# Patient Record
Sex: Female | Born: 1935
Health system: Southern US, Community
[De-identification: ages and names within clinical notes are randomized; demographics above are authoritative.]

## PROBLEM LIST (undated history)

## (undated) DIAGNOSIS — M5030 Other cervical disc degeneration, unspecified cervical region: Secondary | ICD-10-CM

## (undated) DIAGNOSIS — R519 Headache, unspecified: Secondary | ICD-10-CM

## (undated) DIAGNOSIS — R51 Headache: Secondary | ICD-10-CM

## (undated) DIAGNOSIS — E162 Hypoglycemia, unspecified: Secondary | ICD-10-CM

## (undated) DIAGNOSIS — R531 Weakness: Secondary | ICD-10-CM

## (undated) DIAGNOSIS — F0390 Unspecified dementia without behavioral disturbance: Secondary | ICD-10-CM

## (undated) DIAGNOSIS — I10 Essential (primary) hypertension: Secondary | ICD-10-CM

## (undated) DIAGNOSIS — G8929 Other chronic pain: Secondary | ICD-10-CM

## (undated) DIAGNOSIS — J329 Chronic sinusitis, unspecified: Secondary | ICD-10-CM

## (undated) DIAGNOSIS — I2699 Other pulmonary embolism without acute cor pulmonale: Secondary | ICD-10-CM

## (undated) HISTORY — PX: APPENDECTOMY: SHX54

---

## 2000-07-13 ENCOUNTER — Ambulatory Visit (HOSPITAL_COMMUNITY): Admission: RE | Admit: 2000-07-13 | Discharge: 2000-07-13 | Payer: Self-pay | Admitting: Family Medicine

## 2000-12-23 ENCOUNTER — Encounter: Payer: Self-pay | Admitting: Family Medicine

## 2000-12-23 ENCOUNTER — Ambulatory Visit (HOSPITAL_COMMUNITY): Admission: RE | Admit: 2000-12-23 | Discharge: 2000-12-23 | Payer: Self-pay | Admitting: Family Medicine

## 2001-01-14 ENCOUNTER — Ambulatory Visit (HOSPITAL_COMMUNITY): Admission: RE | Admit: 2001-01-14 | Discharge: 2001-01-14 | Payer: Self-pay | Admitting: Family Medicine

## 2001-01-14 ENCOUNTER — Encounter: Payer: Self-pay | Admitting: Family Medicine

## 2001-03-04 ENCOUNTER — Encounter: Payer: Self-pay | Admitting: *Deleted

## 2001-03-04 ENCOUNTER — Emergency Department (HOSPITAL_COMMUNITY): Admission: EM | Admit: 2001-03-04 | Discharge: 2001-03-04 | Payer: Self-pay | Admitting: *Deleted

## 2001-09-01 ENCOUNTER — Encounter: Payer: Self-pay | Admitting: Internal Medicine

## 2001-09-01 ENCOUNTER — Inpatient Hospital Stay (HOSPITAL_COMMUNITY): Admission: EM | Admit: 2001-09-01 | Discharge: 2001-09-02 | Payer: Self-pay | Admitting: Internal Medicine

## 2001-09-04 ENCOUNTER — Emergency Department (HOSPITAL_COMMUNITY): Admission: EM | Admit: 2001-09-04 | Discharge: 2001-09-04 | Payer: Self-pay | Admitting: Emergency Medicine

## 2004-09-09 ENCOUNTER — Emergency Department (HOSPITAL_COMMUNITY): Admission: EM | Admit: 2004-09-09 | Discharge: 2004-09-09 | Payer: Self-pay | Admitting: Emergency Medicine

## 2004-10-10 ENCOUNTER — Emergency Department (HOSPITAL_COMMUNITY): Admission: EM | Admit: 2004-10-10 | Discharge: 2004-10-10 | Payer: Self-pay | Admitting: Emergency Medicine

## 2005-01-24 ENCOUNTER — Emergency Department (HOSPITAL_COMMUNITY): Admission: EM | Admit: 2005-01-24 | Discharge: 2005-01-24 | Payer: Self-pay | Admitting: Emergency Medicine

## 2005-02-21 ENCOUNTER — Emergency Department (HOSPITAL_COMMUNITY): Admission: EM | Admit: 2005-02-21 | Discharge: 2005-02-21 | Payer: Self-pay | Admitting: Emergency Medicine

## 2005-08-20 ENCOUNTER — Emergency Department (HOSPITAL_COMMUNITY): Admission: EM | Admit: 2005-08-20 | Discharge: 2005-08-20 | Payer: Self-pay | Admitting: Emergency Medicine

## 2005-10-31 ENCOUNTER — Emergency Department (HOSPITAL_COMMUNITY): Admission: EM | Admit: 2005-10-31 | Discharge: 2005-10-31 | Payer: Self-pay | Admitting: Emergency Medicine

## 2006-03-14 ENCOUNTER — Emergency Department (HOSPITAL_COMMUNITY): Admission: EM | Admit: 2006-03-14 | Discharge: 2006-03-14 | Payer: Self-pay | Admitting: Emergency Medicine

## 2006-03-23 ENCOUNTER — Emergency Department (HOSPITAL_COMMUNITY): Admission: EM | Admit: 2006-03-23 | Discharge: 2006-03-23 | Payer: Self-pay | Admitting: Emergency Medicine

## 2006-03-26 ENCOUNTER — Emergency Department (HOSPITAL_COMMUNITY): Admission: EM | Admit: 2006-03-26 | Discharge: 2006-03-26 | Payer: Self-pay | Admitting: Emergency Medicine

## 2006-03-28 ENCOUNTER — Emergency Department (HOSPITAL_COMMUNITY): Admission: EM | Admit: 2006-03-28 | Discharge: 2006-03-28 | Payer: Self-pay | Admitting: Emergency Medicine

## 2007-10-10 ENCOUNTER — Emergency Department (HOSPITAL_COMMUNITY): Admission: EM | Admit: 2007-10-10 | Discharge: 2007-10-10 | Payer: Self-pay | Admitting: Emergency Medicine

## 2007-10-27 ENCOUNTER — Emergency Department (HOSPITAL_COMMUNITY): Admission: EM | Admit: 2007-10-27 | Discharge: 2007-10-27 | Payer: Self-pay | Admitting: Emergency Medicine

## 2008-07-10 ENCOUNTER — Emergency Department (HOSPITAL_COMMUNITY): Admission: EM | Admit: 2008-07-10 | Discharge: 2008-07-10 | Payer: Self-pay | Admitting: Emergency Medicine

## 2008-10-16 ENCOUNTER — Emergency Department (HOSPITAL_COMMUNITY): Admission: EM | Admit: 2008-10-16 | Discharge: 2008-10-17 | Payer: Self-pay | Admitting: Emergency Medicine

## 2009-04-28 ENCOUNTER — Emergency Department (HOSPITAL_COMMUNITY): Admission: EM | Admit: 2009-04-28 | Discharge: 2009-04-28 | Payer: Self-pay | Admitting: Emergency Medicine

## 2010-06-23 LAB — URINALYSIS, ROUTINE W REFLEX MICROSCOPIC
Glucose, UA: NEGATIVE mg/dL
Nitrite: NEGATIVE
Protein, ur: NEGATIVE mg/dL
pH: 7 (ref 5.0–8.0)

## 2010-06-23 LAB — CBC
Hemoglobin: 15.7 g/dL — ABNORMAL HIGH (ref 12.0–15.0)
MCHC: 33.6 g/dL (ref 30.0–36.0)
Platelets: 141 10*3/uL — ABNORMAL LOW (ref 150–400)
RBC: 4.78 MIL/uL (ref 3.87–5.11)
RDW: 13.5 % (ref 11.5–15.5)

## 2010-06-23 LAB — DIFFERENTIAL
Lymphocytes Relative: 28 % (ref 12–46)
Lymphs Abs: 1.4 10*3/uL (ref 0.7–4.0)
Monocytes Absolute: 0.4 10*3/uL (ref 0.1–1.0)
Monocytes Relative: 9 % (ref 3–12)
Neutro Abs: 3.1 10*3/uL (ref 1.7–7.7)

## 2010-06-23 LAB — BASIC METABOLIC PANEL
CO2: 28 mEq/L (ref 19–32)
Calcium: 8.9 mg/dL (ref 8.4–10.5)
GFR calc Af Amer: >60 mL/min (ref >60)
GFR calc non Af Amer: >60 mL/min (ref >60)
Sodium: 140 mEq/L (ref 135–145)

## 2010-06-23 LAB — GLUCOSE, CAPILLARY: Glucose-Capillary: 98 mg/dL (ref 70–99)

## 2010-07-14 LAB — POCT CARDIAC MARKERS
CKMB, poc: 1.5 ng/mL (ref 1.0–8.0)
CKMB, poc: 3.1 ng/mL (ref 1.0–8.0)
Troponin i, poc: <0.05 ng/mL (ref 0.00–0.09)

## 2010-07-14 LAB — URINALYSIS, ROUTINE W REFLEX MICROSCOPIC
Hgb urine dipstick: NEGATIVE
Ketones, ur: NEGATIVE mg/dL
Nitrite: NEGATIVE
Protein, ur: NEGATIVE mg/dL

## 2010-07-14 LAB — URINE MICROSCOPIC-ADD ON

## 2010-07-14 LAB — GLUCOSE, CAPILLARY: Glucose-Capillary: 100 mg/dL — ABNORMAL HIGH (ref 70–99)

## 2010-07-14 LAB — POCT I-STAT, CHEM 8
Chloride: 106 mEq/L (ref 96–112)
Creatinine, Ser: 0.7 mg/dL (ref 0.4–1.2)
Glucose, Bld: 137 mg/dL — ABNORMAL HIGH (ref 70–99)
HCT: 48 % — ABNORMAL HIGH (ref 36.0–46.0)
Potassium: 3.9 mEq/L (ref 3.5–5.1)
Sodium: 141 mEq/L (ref 135–145)

## 2010-07-17 LAB — COMPREHENSIVE METABOLIC PANEL
AST: 19 U/L (ref 0–37)
CO2: 26 mEq/L (ref 19–32)
Calcium: 8.8 mg/dL (ref 8.4–10.5)
Creatinine, Ser: 0.6 mg/dL (ref 0.4–1.2)
GFR calc Af Amer: >60 mL/min (ref >60)
GFR calc non Af Amer: >60 mL/min (ref >60)
Total Protein: 6.4 g/dL (ref 6.0–8.3)

## 2010-07-17 LAB — URINALYSIS, ROUTINE W REFLEX MICROSCOPIC
Bilirubin Urine: NEGATIVE
Hgb urine dipstick: NEGATIVE
Ketones, ur: NEGATIVE mg/dL
Nitrite: NEGATIVE
Specific Gravity, Urine: <1.005 — ABNORMAL LOW (ref 1.005–1.030)

## 2010-07-17 LAB — DIFFERENTIAL
Basophils Absolute: 0 10*3/uL (ref 0.0–0.1)
Eosinophils Absolute: 0.1 10*3/uL (ref 0.0–0.7)
Eosinophils Relative: 2 % (ref 0–5)
Lymphocytes Relative: 41 % (ref 12–46)
Lymphs Abs: 2.4 10*3/uL (ref 0.7–4.0)
Neutrophils Relative %: 50 % (ref 43–77)

## 2010-07-17 LAB — GLUCOSE, CAPILLARY
Glucose-Capillary: 104 mg/dL — ABNORMAL HIGH (ref 70–99)
Glucose-Capillary: 106 mg/dL — ABNORMAL HIGH (ref 70–99)

## 2010-07-17 LAB — CBC
Hemoglobin: 15.9 g/dL — ABNORMAL HIGH (ref 12.0–15.0)
RBC: 4.78 MIL/uL (ref 3.87–5.11)
WBC: 5.9 10*3/uL (ref 4.0–10.5)

## 2010-07-17 LAB — URINE CULTURE

## 2010-07-17 LAB — APTT: aPTT: 27 seconds (ref 24–37)

## 2010-07-17 LAB — PROTIME-INR: INR: 1 (ref 0.00–1.49)

## 2010-08-23 NOTE — Consult Note (Signed)
[facility]  Patient:    [name], [name] Visit Number: [id number] MRN: [id number]          Service Type: MED Location: 3A A318 01 Attending Physician:  Cassell Smiles. Dictated by:   Tana Coast, P.A.C. Proc. Date: 09/01/01 Admit Date:  09/01/2001   CC:         Butch Penny, M.D.   Consultation Report  DATE OF BIRTH:  Dec 10, 1919.  REFERRING PHYSICIAN:  Butch Penny, M.D.  REASON FOR CONSULTATION:  Nausea, vomiting, diarrhea, hemoccult-positive stool.  HISTORY OF PRESENT ILLNESS:  The patient is an 77 year old Caucasian female patient of Dr. Renard Matter who was admitted overnight by Dr. Josefine Class (Hospitalists) for acute onset nausea, vomiting, diarrhea, with evidence of dehydration.  The patient reports eating dinner last night and approximately one hour later developed abdominal cramping followed by multiple diarrheal stools.  She took at least 3 Imodium tablets with no relief.  She, therefore, presented to the emergency department for further evaluation.  In the ED, she developed nausea and vomiting on several occasions.  There was no evidence of hematemesis, melena, or bright red blood per rectum.  She had a hemoccult-positive stool while in the emergency department.  The patient denies any history of fever or chills, chronic diarrhea, nausea or vomiting prior to this episode, heartburn, dysphagia, odynophagia, weight loss, anorexia.  She has had no history of travel or ill contacts.  She had not eaten out at restaurants.  At home prior to this episode, she was eating a boiled potato, chopped steak well done, and lima beans.  In the emergency department, white count 13.5, hemoglobin 17.5, hematocrit 51.3, neutrophils 79%, lymphocytes 16%, platelet 196,000.  Sodium 139, potassium 5.1, BUN 15, creatinine 1.1, glucose 151, total bilirubin 1.4, alkaline phosphatase 79, SGOT 47, SGPT 33, albumin 4.5, amylase 78, lipase 28. Urinalysis  revealed a cloudy yellow urine with 30 protein, small leukocyte esterase, 11 to 20 wbcs, many bacteria, specific gravity 1.020.  At 7 a.m. this morning, her potassium was 4.2, total bilirubin 1.0, alkaline phosphatase 64, SGOT 26, SGPT 30, albumin 3.4.  Direct bilirubin 0.2, indirect bilirubin 0.8.  The patient already feels better this afternoon.  She has had one bowel movement this morning and no more nausea or vomiting.  She was able to take a clear liquid lunch.  The patient takes aspirin on an as-needed basis for headaches and pain.  She has a family history of colon cancer with a brother who died at age 4. She had an upper GI series in October 2002 which revealed a small sliding hiatal hernia, mild to moderate duodenitis involving the bulb, possible gallstone.  Acute abdominal series this morning revealed probable gallstone and cardiomegaly but otherwise unremarkable.  HOME MEDICATIONS:  Aspirin p.r.n.  CURRENT MEDICATIONS: 1. Protonix 40 mg IV x 1. 2. Phenergan 12.5 mg IV q.6h. p.r.n. nausea. 3. Normal saline at 100 cc per hour.  ALLERGIES:  No known drug allergies.  PAST MEDICAL HISTORY: 1. Remote history of ovarian cyst in her 45s requiring surgical removal    with incidental appendectomy at that time. 2. History of right axillary lymph node biopsy which was benign, remotely. 3. The patient gives a history of taking Nexium last year for a possible    ulcer.  Upper GI series showed evidence of mild to moderate duodenitis    involving the bulbar duodenum at that time.  FAMILY HISTORY:  Her brother died at age 29 of colon cancer.  Her father died suddenly at 47 of unknown cause.  She had a daughter who died of breast cancer at age 26.  SOCIAL HISTORY:  She is widowed, had seven children, six living.  She lives with her son.  She denies any tobacco or alcohol use.  She is retired.  REVIEW OF SYSTEMS:  Please see HPI for GI.  General: Denies weight loss, weakness, or  fatigue.  Cardiopulmonary: Denies any chest pain or shortness of breath.  Genitourinary: Complains of urinary frequency but no dysuria.  PHYSICAL EXAMINATION:  VITAL SIGNS:  Weight 180.7.  T-max 99, current temperature 99.5.  Pulse 100, respirations 20, blood pressure 154/64.  GENERAL:  Very pleasant, well-nourished, well-developed, elderly Caucasian female in no acute distress.  SKIN:  Warn and dry, no jaundice.  HEENT:  Pupils are equal, round and reactive to light.  Conjunctivae pink. Sclerae nonicteric.  Oropharyngeal mucosa moist and pink.  No lesions, erythema, or exudate.  NECK:  No adenopathy, thyromegaly, or carotid bruits.  CHEST:  Lungs clear to auscultation.  CARDIAC:  Regular rate and rhythm, normal S1 and S2.  No murmurs, rubs, or gallops.  ABDOMEN:  Positive bowel sounds.  Protuberant but symmetrical.  Nontender. No organomegaly or masses.  RECTAL:  Adequate sphincter tone.  No masses in the rectal vault.  Yellow stool is strongly hemoccult positive.  EXTREMITIES:  No cyanosis or edema.  DIAGNOSTIC DATA:  As mentioned in HPI.  IMPRESSION:  The patient is an 77 year old Caucasian female with acute onset of nausea, vomiting, diarrhea, and found to have hemoccult-positive stools on rectal examination.  She also has a mild leukocytosis and a transient, mildly elevated total bilirubin and SGOT.  1. Suspect acute gastroenteritis, possibly food-borne illness versus viral.    Symptomatically, the patient is better today.  Symptoms seem to be    resolved. 2. Hemoccult-positive stool, probably incidental finding.  Hemoglobin and    hematocrit are up on admission, most likely secondary to dehydration.  Will    recheck her CBC in the morning to verify that there has not been a    significant drop.  Given her hemoccult-positive stools, would recommend    a colonoscopy (has family history of colon cancer, and the patient has    never undergone colonoscopy previously)  plus/minus     esophagogastroduodenoscopy.  The patient really has no symptoms worrisome    for peptic ulcer disease, but she is on aspirin p.r.n., and an upper    gastrointestinal series in October 2002 revealed mild to moderate    duodenitis. 3. Slight increase in SGOT and total bilirubin on admission, now resolved.    I suspect the slight bump in these numbers is secondary to acute    gastroenteritis.  At this time, I do not suspect biliary etiology. 4. Urinalysis revealed evidence of leukocyte esterase, 11 to 20 white    blood cells, and many bacteria.  The patient complains of urinary frequency    but no dysuria.  SUGGESTIONS: 1. Recheck CBC in the morning. 2. Consider urine culture. 3. Probable colonoscopy and EGD, questionably as an outpatient (to discuss    with Dr. Karilyn Cota).  The patient needs to have at least a colonoscopy    given her family history of colon cancer.  If there are no findings to    explain hemoccult-positive stool, would need to have an EGD as well.  We would like to thank Dr. Renard Matter for allowing [place] to take part in the care of  this patient. Dictated by:   Tana Coast, P.A.C. Attending Physician:  Cassell Smiles DD:  09/01/01 TD:  09/01/01 Job: [phone] [id number]

## 2010-08-23 NOTE — H&P (Signed)
[facility]  Patient:    [name], [name] Visit Number: [id number] MRN: [id number]          Service Type: MED Location: 3A A318 01 Attending Physician:  Cassell Smiles. Dictated by:   Renne Musca, M.D. Admit Date:  09/01/2001                           History and Physical  DATE OF BIRTH:  12/25/1919  HISTORY OF PRESENT ILLNESS:  The patient is an 77 year old Caucasian female, followed by Dr. Renard Matter, with an unremarkable past medical history, actually on no chronic medications, usually feels quite well, and was in her normal state of health until the evening prior to admission when after a small dinner of baked potato and chopped steak which she prepared at home she noted some abdominal cramping, which progressively worsened, and developed copious diarrhea, initially brown and formed and then noted to be black and watery. Because of ongoing abdominal pain and nausea she presented to the emergency room, where she proceeded to vomit quite a large amount.  She received Phenergan and Demerol in the emergency room as well as IV fluids and overall felt much improved.  On initial examination per Dr. Sherwood Gambler she was noted to have heme positive stools by guaiac, though no gross blood was noted.  The patient has no preexisting GI symptoms; i.e., reflux, change in her bowel habits (which are normally every day to every other day), no blood, mucus, or other complaints.  She has had no change in her appetite.  She has actually gained about 20 pounds over the last several years.  Overall she has been quite active.  She had no preceding fever or myalgias, arthralgias, headache, or anything else to suggest an acute viral syndrome.  No recent ingestion of suspicious foods; i.e., mayonnaise based, undercooked eggs, meat, etc.  She rarely eats out.  PAST MEDICAL HISTORY:  No hospitalization aside from:  1. Appendectomy.  2. Ovarian cyst many years  ago.  ALLERGIES:  No history of drug allergies.  CURRENT MEDICATIONS:  1. She took an Imodium last night but usually uses little over-the-counter     medications.  No supplements are noted.  2. Occasional vitamin C.  3. Occasional Tylenol.  SOCIAL HISTORY:  She denies any alcohol use or abuse.  No smoking.  She lives with her son and grandson, who are in good health and have no other symptoms; they tend to eat the same foods.  She is a Futures trader.  She has been a widow for ten years.  FAMILY HISTORY:  Pertinent for ______ who died with stomach cancer.  Brother with valvular heart disease, died at age 51.  She has a daughter age 40 die from breast cancer.  She has five sons and one remaining daughter, all alive and well.  Her father died of sudden death at age 29.  Mother died of stroke at age 34.  REVIEW OF SYSTEMS:  The patient is independent.  Functional.  No cardiovascular, GI, endocrine, or neurologic findings on Review Of Systems. She has not had a mammogram in many years.  She has never had a screening colonoscopy.  History of palpitations, although none recently.  PHYSICAL EXAMINATION:  GENERAL:  Alert, oriented, well-developed, well-nourished Caucasian female, who appears younger than her stated age.  VITAL SIGNS:  Blood pressure 130/70, pulse 110 and regular, respirations unlabored.  NEUROLOGIC:  Completely intact.  HEENT:  Oropharynx is slightly dry.  Conjunctivae clear.  PERRL.  Fundi not visualized.  NECK:  Supple.  No JVD, adenopathy, thyromegaly.  LUNGS:  Clear to auscultation anteriorly and posteriorly.  HEART:  Regular, tachycardic.  No murmur or gallop noted.  ABDOMEN:   Obese, soft, nontender to latent deep palpation.  Bowel sounds are positive.  EXTREMITIES:  Without clubbing, cyanosis, or edema.  Pulses distally are intact.  SKIN:  Without rash, lesion, or breakdown.  She has multiple cherry angiomas but no worrisome findings.  LABORATORY DATA:   UA with small amount of leukocyte esterase.  Glucose 151, potassium 5.1, sodium 139, BUN 15, creatinine 1.1, albumin 4.5, SGOT 47, alkaline phosphatase 79, bilirubin 1.4.  Amylase 78, lipase 28.  Hemoglobin 17.5, hematocrit 51, WBC 13.5; platelets, however, 196,000; 79% segs, 16% lymphs.  Acute abdominal series revealed no evidence of obstruction or free air.  ASSESSMENT:  1. Acute onset of abdominal pain associated with diarrhea and vomiting, most     likely viral versus ingestion.  Appears self-limited.  Otherwise, appears     quite comfortable.  2. Mild dehydration.  3. Heme positive stools.  4. Hyperglycemia.  5. Hemoconcentration versus other.  PLAN:  1. Admit to the hospital.  2. Hydrate.  3. She is hemodynamically stable.  Will check liver profile and monitor.  4. GI consultation has been obtained for the heme positive stools.  5. Follow up hyperglycemia.  She has only received saline in the emergency     room.  Hemoglobin A1C would be reasonable to assess for underlying     diabetes.  6. Hemoconcentration with associated leukocytosis with normal Differential.     Certainly may represent volume depletion but also need to consider other     issues such as polycythemia, although her platelet count is normal.  The     patient did receive some potassium as her initial potassium was reported     as being 3 but this has been corrected.  A stat serum potassium is pending     at this time and she did not receive the 40 mEq of potassium ordered in     the emergency room.  7. The patient at some point will need a screening mammogram and colonoscopy,     although probably not necessary during this hospitalization. Dictated by:   Renne Musca, M.D. Attending Physician:  Cassell Smiles DD:  09/01/01 TD:  09/02/01 Job: [id number]

## 2010-08-23 NOTE — Discharge Summary (Signed)
[facility]  Patient:    [name], [name] Visit Number: [id number] MRN: [id number]          Service Type: EMS Location: ED Attending Physician:  Herbert Seta Dictated by:   Butch Penny, M.D. Admit Date:  09/04/2001 Discharge Date: 09/04/2001                             Discharge Summary  DISCHARGE DIAGNOSES: 1. Urinary tract infection. 2. Gastroenteritis. 3. Dehydration. 4. Hyperglycemia.  CONDITION ON DISCHARGE:  Stable and improved.  HISTORY OF PRESENT ILLNESS:  This 77 year old female developed abdominal, crampy pain which progressively worsened.  She developed copious diarrhea that was black and watery.  Because of ongoing abdominal pain and nausea, she presented to the emergency room.  She was given Phenergan and Demerol. Intravenous fluids were started.  The patient was admitted for further care.  PHYSICAL EXAMINATION:  GENERAL:  Alert and oriented female.  VITAL SIGNS:  Blood pressure 130/70, pulse 110.  HEENT:  PERRLA.  TMs negative.  Oropharynx benign.  NECK:  Supple.  No JVD or thyroid abnormalities.  LUNGS:  Clear to P&A.  HEART:  Regular rate and rhythm with no murmurs.  ABDOMEN:  Soft, bowel sounds positive.  LABORATORY DATA AND X-RAY FINDINGS:  Admission CBC with WBC 13,500, hemoglobin 17.5, hematocrit 51.3.  Chemistries with sodium 139, potassium 5.1, chloride 105, CO2 26, glucose 151, BUN 15, creatinine 1.1, calcium 9.3.  Total protein 7.3, albumin 4.5.  Liver enzymes with SGOT 47, SGPT 33, total bilirubin 1.4. Urinalysis 11-20 wbcs.  HOSPITAL COURSE:  The patient, at the time of admission, was started on intravenous fluids with normal saline at 100 cc per hour with 20 mEq of Kay-Ciel to each bottle of fluid.  She was started on Protonix intravenously 40 mg daily.  Phenergan 12.5 mg IV q.6-8h. p.r.n. nausea.  The patient improved and urine was cultured.  The patient was scheduled for colonoscopy and then it  was canceled.  The patient was discharged after one day of hospitalization to be followed as an outpatient.  DISCHARGE MEDICATION:  Protonix 40 mg daily.  DIET:  Soft.  SPECIAL INSTRUCTIONS:  Gastroenterology service will do colonoscopy as an outpatient.  CONDITION ON DISCHARGE:  Stable. Dictated by:   Butch Penny, M.D. Attending Physician:  Herbert Seta DD:  10/04/01 TD:  10/05/01 Job: [id number]

## 2011-01-03 LAB — URINALYSIS, ROUTINE W REFLEX MICROSCOPIC
Nitrite: NEGATIVE
Protein, ur: NEGATIVE
Specific Gravity, Urine: <1.005 — ABNORMAL LOW
Urobilinogen, UA: 0.2

## 2011-01-03 LAB — DIFFERENTIAL
Lymphocytes Relative: 38
Monocytes Absolute: 0.4
Monocytes Relative: 7
Neutro Abs: 2.9

## 2011-01-03 LAB — COMPREHENSIVE METABOLIC PANEL
Albumin: 3.9
Alkaline Phosphatase: 62
BUN: 13
CO2: 29
Chloride: 108
Creatinine, Ser: 0.69
Glucose, Bld: 105 — ABNORMAL HIGH
Sodium: 141
Total Protein: 6.3

## 2011-01-03 LAB — CBC
Platelets: 155
RDW: 13.7
WBC: 5.5

## 2011-01-04 ENCOUNTER — Emergency Department (HOSPITAL_COMMUNITY)
Admission: EM | Admit: 2011-01-04 | Discharge: 2011-01-05 | Disposition: A | Discharge status: Home / Self Care | Payer: Medicare PPO | Attending: Emergency Medicine | Admitting: Emergency Medicine

## 2011-01-04 ENCOUNTER — Encounter: Payer: Self-pay | Admitting: *Deleted

## 2011-01-04 DIAGNOSIS — R531 Weakness: Secondary | ICD-10-CM

## 2011-01-04 DIAGNOSIS — Z79899 Other long term (current) drug therapy: Secondary | ICD-10-CM | POA: Insufficient documentation

## 2011-01-04 DIAGNOSIS — I10 Essential (primary) hypertension: Secondary | ICD-10-CM | POA: Insufficient documentation

## 2011-01-04 DIAGNOSIS — R5381 Other malaise: Secondary | ICD-10-CM | POA: Insufficient documentation

## 2011-01-04 HISTORY — DX: Essential (primary) hypertension: I10

## 2011-01-04 HISTORY — DX: Hypoglycemia, unspecified: E16.2

## 2011-01-04 LAB — CBC
Hemoglobin: 15.9 g/dL — ABNORMAL HIGH (ref 12.0–15.0)
MCH: 32.5 pg (ref 26.0–34.0)
MCV: 95.9 fL (ref 78.0–100.0)
RBC: 4.89 MIL/uL (ref 3.87–5.11)

## 2011-01-04 LAB — BASIC METABOLIC PANEL
BUN: 13 mg/dL (ref 6–23)
Calcium: 9.1 mg/dL (ref 8.4–10.5)
GFR calc non Af Amer: >60 mL/min (ref >60)
Glucose, Bld: 105 mg/dL — ABNORMAL HIGH (ref 70–99)

## 2011-01-04 LAB — URINALYSIS, ROUTINE W REFLEX MICROSCOPIC
Bilirubin Urine: NEGATIVE
Hgb urine dipstick: NEGATIVE
Protein, ur: NEGATIVE mg/dL
Specific Gravity, Urine: <1.005 — ABNORMAL LOW (ref 1.005–1.030)
Urobilinogen, UA: 0.2 mg/dL (ref 0.0–1.0)

## 2011-01-04 LAB — DIFFERENTIAL
Eosinophils Absolute: 0.1 10*3/uL (ref 0.0–0.7)
Eosinophils Relative: 2 % (ref 0–5)
Lymphs Abs: 2.3 10*3/uL (ref 0.7–4.0)
Monocytes Relative: 8 % (ref 3–12)

## 2011-01-04 LAB — URINE MICROSCOPIC-ADD ON

## 2011-01-04 NOTE — ED Notes (Signed)
Pt c/o feeling shaky and weak earlier today.

## 2011-01-04 NOTE — ED Provider Notes (Signed)
History     CSN: [id number] Arrival date & time: 01/04/2011  9:55 PM  Chief Complaint  Patient presents with  . Weakness  . Shaking    (Consider location/radiation/quality/duration/timing/severity/associated sxs/prior treatment) HPI Comments: Seen 2310. Patient states she has had a h/o of hypoglycemia in the past. She usually feels shaky prior to feeling weak. Tonight she felt shaky and weak. She took 2 glucose pills and had her son bring her to the ER.r. She eats three meals a day on a regular basis and has at least two snacks during the day. When she feels shaky, she usually has a protein snack. Tonight she felt her blood pressure might be elevated as well. She lives with her son and grandson. Dr. Renard Matter is her PCP.  Patient is a 77 y.o. female presenting with weakness. The history is provided by the patient (Patient states that she feels shaky when her sugars are low. She felt like that earlier tonight. Took 2 glucose tablets and had her son bring her to the ER for evaluaion. ).  Weakness Primary symptoms comment: generalized weakness and feeling shaky The symptoms are improving. Context: sitting in her home.  Additional symptoms include weakness. Additional symptoms do not include pain, tinnitus or vertigo.    Past Medical History  Diagnosis Date  . Hypertension   . Hypoglycemia     History reviewed. No pertinent past surgical history.  No family history on file.  History  Substance Use Topics  . Smoking status: Never Smoker   . Smokeless tobacco: Not on file  . Alcohol Use: No    OB History    Grav Para Term Preterm Abortions TAB SAB Ect Mult Living                  Review of Systems  HENT: Negative for tinnitus.   Neurological: Positive for weakness. Negative for vertigo.    Allergies  Codeine  Home Medications   Current Outpatient Rx  Name Route Sig Dispense Refill  . AMLODIPINE BESYLATE 5 MG PO TABS Oral Take 5 mg by mouth daily.      Marland Kitchen LOSARTAN  POTASSIUM 50 MG PO TABS Oral Take 50 mg by mouth daily.        BP 179/67  Pulse 90  Temp 98.6 F (37 C)  Resp 20  Ht 5\' 2"  (1.575 m)  Wt 187 lb (84.823 kg)  BMI 34.20 kg/m2  SpO2 100%  Physical Exam  Constitutional: She is oriented to person, place, and time. She appears well-developed and well-nourished. No distress.  HENT:  Head: Normocephalic and atraumatic.  Eyes: EOM are normal.  Neck: Normal range of motion. Neck supple.  Cardiovascular: Normal rate, normal heart sounds and intact distal pulses.   Pulmonary/Chest: Effort normal and breath sounds normal.  Abdominal: Soft.  Musculoskeletal: Normal range of motion.  Neurological: She is alert and oriented to person, place, and time.  Skin: Skin is warm and dry.    ED Course  Procedures (including critical care time)  Labs Reviewed  GLUCOSE, CAPILLARY - Abnormal; Notable for the following:    Glucose-Capillary 137 (*)    All other components within normal limits  CBC - Abnormal; Notable for the following:    Hemoglobin 15.9 (*)    HCT 46.9 (*)    All other components within normal limits  BASIC METABOLIC PANEL - Abnormal; Notable for the following:    Glucose, Bld 105 (*)    All other components within normal limits  URINALYSIS, ROUTINE W REFLEX MICROSCOPIC - Abnormal; Notable for the following:    Color, Urine STRAW (*)    Specific Gravity, Urine <1.005 (*)    Leukocytes, UA TRACE (*)    All other components within normal limits  DIFFERENTIAL  URINE MICROSCOPIC-ADD ON   Patient presented with weakness and shakiness. Had taken glucose tablets at home. Labs reflect having taken glucose with CBG 137 and BMET with glucose of 105. Patient feels better. Has been ambulated in the hallway without difficulty. Taken PO fluids. VSS. Pt feels improved after observation and/or treatment in ED.Patient and family understand and agree with initial ED impression and plan with expectations set for ED visit. MDM Reviewed: nursing  note and vitals Interpretation: labs          Nicoletta Dress. Colon Branch, MD 01/05/11 1610

## 2011-01-05 NOTE — ED Notes (Signed)
Pt ambulated around nurses' desk with no assistance; pt had no complaints; VS taken and stable after ambulation

## 2012-05-19 ENCOUNTER — Encounter (HOSPITAL_COMMUNITY): Payer: Self-pay

## 2012-05-19 ENCOUNTER — Emergency Department (HOSPITAL_COMMUNITY): Payer: Medicare Other

## 2012-05-19 ENCOUNTER — Emergency Department (HOSPITAL_COMMUNITY)
Admission: EM | Admit: 2012-05-19 | Discharge: 2012-05-19 | Disposition: A | Discharge status: Home / Self Care | Payer: Medicare Other | Attending: Emergency Medicine | Admitting: Emergency Medicine

## 2012-05-19 DIAGNOSIS — M76899 Other specified enthesopathies of unspecified lower limb, excluding foot: Secondary | ICD-10-CM | POA: Insufficient documentation

## 2012-05-19 DIAGNOSIS — M7071 Other bursitis of hip, right hip: Secondary | ICD-10-CM

## 2012-05-19 DIAGNOSIS — Z79899 Other long term (current) drug therapy: Secondary | ICD-10-CM | POA: Insufficient documentation

## 2012-05-19 DIAGNOSIS — Z862 Personal history of diseases of the blood and blood-forming organs and certain disorders involving the immune mechanism: Secondary | ICD-10-CM | POA: Insufficient documentation

## 2012-05-19 DIAGNOSIS — I10 Essential (primary) hypertension: Secondary | ICD-10-CM | POA: Insufficient documentation

## 2012-05-19 DIAGNOSIS — Z8639 Personal history of other endocrine, nutritional and metabolic disease: Secondary | ICD-10-CM | POA: Insufficient documentation

## 2012-05-19 MED ORDER — PREDNISONE 20 MG PO TABS: ORAL_TABLET | ORAL | Status: DC

## 2012-05-19 MED ORDER — OXYCODONE-ACETAMINOPHEN 5-325 MG PO TABS: 1.0000 | ORAL_TABLET | ORAL | Status: DC | PRN

## 2012-05-19 NOTE — ED Provider Notes (Deleted)
History     CSN: [id number]  Arrival date & time 05/19/12  0830   First MD Initiated Contact with Patient 05/19/12 [phone]      Chief Complaint  Patient presents with  . Hip Pain    (Consider location/radiation/quality/duration/timing/severity/associated sxs/prior treatment) HPI  Past Medical History  Diagnosis Date  . Hypertension   . Hypoglycemia     History reviewed. No pertinent past surgical history.  No family history on file.  History  Substance Use Topics  . Smoking status: Never Smoker   . Smokeless tobacco: Not on file  . Alcohol Use: No    OB History   Grav Para Term Preterm Abortions TAB SAB Ect Mult Living                  Review of Systems  Allergies  Codeine  Home Medications   Current Outpatient Rx  Name  Route  Sig  Dispense  Refill  . amLODipine (NORVASC) 5 MG tablet   Oral   Take 5 mg by mouth daily.           Marland Kitchen ibuprofen (ADVIL,MOTRIN) 200 MG tablet   Oral   Take 400 mg by mouth every 6 (six) hours as needed for pain.         Marland Kitchen losartan (COZAAR) 50 MG tablet   Oral   Take 50 mg by mouth daily.           Marland Kitchen oxyCODONE-acetaminophen (PERCOCET) 5-325 MG per tablet   Oral   Take 1 tablet by mouth every 4 (four) hours as needed for pain.   20 tablet   0   . predniSONE (DELTASONE) 20 MG tablet      3 tabs po day one, then 2 tabs daily x 4 days   11 tablet   0     BP 191/85  Pulse 104  Temp(Src) 97.9 F (36.6 C) (Oral)  Resp 18  Ht 5\' 2"  (1.575 m)  Wt 150 lb (68.04 kg)  BMI 27.43 kg/m2  SpO2 95%  Physical Exam  ED Course  Procedures (including critical care time)  Labs Reviewed - No data to display Dg Hip Complete Right  05/19/2012  *RADIOLOGY REPORT*  Clinical Data: Pain  RIGHT HIP - COMPLETE 2+ VIEW  Comparison: None.  Findings: Mild bilateral degenerative changes involve the hip joints.  No fracture or subluxation identified.  No radio-opaque foreign body or soft tissue calcifications identified.  IMPRESSION:   1.  Mild bilateral osteoarthritis. 2.  No acute findings.   Original Report Authenticated By: Signa Kell, M.D.      1. Bursitis of hip, right       MDM  History and physical consistent with right hip bursitis.  Rx ibuprofen, Percocet, prednisone. Followup with orthopedics. Plain x-ray negative        Donnetta Hutching, MD 05/19/12 1019

## 2012-05-19 NOTE — ED Notes (Signed)
Pt c/o pain in r hip for the past few weeks.  Denies any injury but says pain is progressively getting worse.

## 2012-05-19 NOTE — ED Provider Notes (Signed)
History    This chart was scribed for Donnetta Hutching, MD by Charolett Bumpers, ED Scribe. The patient was seen in room APA04/[facility]04. Patient's care was started at [phone].   CSN: [id number]  Arrival date & time 05/19/12  0830   First MD Initiated Contact with Patient 05/19/12 [phone]      Chief Complaint  Patient presents with  . Hip Pain    The history is provided by the patient. No language interpreter was used.   [name] is a 79 y.o. female who presents to the Emergency Department complaining of gradual onset, gradually worsening constant right lateral hip pain over the past couple of weeks. She denies any radiation of pain. She denies any recent falls or injuries. She states that she has taken 2 tablets of 200 mg Ibuprofen twice daily with minimal relief.   PCP: Dr. Renard Matter   Past Medical History  Diagnosis Date  . Hypertension   . Hypoglycemia     History reviewed. No pertinent past surgical history.  No family history on file.  History  Substance Use Topics  . Smoking status: Never Smoker   . Smokeless tobacco: Not on file  . Alcohol Use: No    OB History   Grav Para Term Preterm Abortions TAB SAB Ect Mult Living                  Review of Systems A complete 10 system review of systems was obtained and all systems are negative except as noted in the HPI and PMH.   Allergies  Codeine  Home Medications   Current Outpatient Rx  Name  Route  Sig  Dispense  Refill  . amLODipine (NORVASC) 5 MG tablet   Oral   Take 5 mg by mouth daily.           Marland Kitchen ibuprofen (ADVIL,MOTRIN) 200 MG tablet   Oral   Take 400 mg by mouth every 6 (six) hours as needed for pain.         Marland Kitchen losartan (COZAAR) 50 MG tablet   Oral   Take 50 mg by mouth daily.             BP 191/85  Pulse 104  Temp(Src) 97.9 F (36.6 C) (Oral)  Resp 18  Ht 5\' 2"  (1.575 m)  Wt 150 lb (68.04 kg)  BMI 27.43 kg/m2  SpO2 95%  Physical Exam  Nursing note and vitals  reviewed. Constitutional: She is oriented to person, place, and time. She appears well-developed and well-nourished.  HENT:  Head: Normocephalic and atraumatic.  Eyes: Conjunctivae and EOM are normal. Pupils are equal, round, and reactive to light.  Neck: Normal range of motion. Neck supple.  Cardiovascular: Normal rate, regular rhythm and normal heart sounds.   Pulmonary/Chest: Effort normal and breath sounds normal.  Abdominal: Soft. Bowel sounds are normal.  Musculoskeletal: Normal range of motion. She exhibits tenderness.  Minimal tenderness over the right lateral hip.   Neurological: She is alert and oriented to person, place, and time.  Skin: Skin is warm and dry.  Psychiatric: She has a normal mood and affect.    ED Course  Procedures (including critical care time)  DIAGNOSTIC STUDIES: Oxygen Saturation is 95% on room air, adequate by my interpretation.    COORDINATION OF CARE:  09:42-Informed pt of x-ray results. Discussed planned course of treatment with the patient, who is agreeable at this time. Advised to take 3 Ibuprofen three times daily. Will d/c  home with Prednisone and additional pain prescription for break through pain.  10:15-D/c with prescriptions for x20 tablets of Percocet and x11 tablets of prednisone.    Labs Reviewed - No data to display Dg Hip Complete Right  05/19/2012  *RADIOLOGY REPORT*  Clinical Data: Pain  RIGHT HIP - COMPLETE 2+ VIEW  Comparison: None.  Findings: Mild bilateral degenerative changes involve the hip joints.  No fracture or subluxation identified.  No radio-opaque foreign body or soft tissue calcifications identified.  IMPRESSION:  1.  Mild bilateral osteoarthritis. 2.  No acute findings.   Original Report Authenticated By: Signa Kell, M.D.      No diagnosis found.    MDM  History and physical consistent with right hip bursitis.  Plain films negative. Rx ibuprofen, prednisone, Percocet. Followup with orthopedics    I  personally performed the services described in this documentation, which was scribed in my presence. The recorded information has been reviewed and is accurate.      Donnetta Hutching, MD 05/19/12 1021

## 2012-06-14 ENCOUNTER — Encounter (HOSPITAL_COMMUNITY): Payer: Self-pay

## 2012-06-14 ENCOUNTER — Emergency Department (HOSPITAL_COMMUNITY)
Admission: EM | Admit: 2012-06-14 | Discharge: 2012-06-14 | Discharge status: Against Medical Advice | Payer: Medicare Other | Attending: Emergency Medicine | Admitting: Emergency Medicine

## 2012-06-14 DIAGNOSIS — R197 Diarrhea, unspecified: Secondary | ICD-10-CM | POA: Insufficient documentation

## 2012-06-14 MED ORDER — SODIUM CHLORIDE 0.9 % IV SOLN: INTRAVENOUS | Status: DC

## 2012-06-14 MED ORDER — ONDANSETRON HCL 4 MG/2ML IJ SOLN: 4.0000 mg | Freq: Once | INTRAMUSCULAR | Status: DC

## 2012-06-14 NOTE — ED Notes (Signed)
Vomiting and diarrhea, started a couple of hours ago per pt.

## 2012-06-14 NOTE — ED Notes (Signed)
Pt's family told Engineer, materials that the patient was too sick to wait here any longer and they were going to [facility]

## 2012-06-21 ENCOUNTER — Inpatient Hospital Stay (HOSPITAL_COMMUNITY)
Admission: EM | Admit: 2012-06-21 | Discharge: 2012-06-24 | DRG: 299 | Disposition: A | Discharge status: Home Health | Payer: Medicare Other | Attending: Family Medicine | Admitting: Family Medicine

## 2012-06-21 ENCOUNTER — Emergency Department (HOSPITAL_COMMUNITY): Payer: Medicare Other

## 2012-06-21 ENCOUNTER — Encounter (HOSPITAL_COMMUNITY): Payer: Self-pay | Admitting: *Deleted

## 2012-06-21 DIAGNOSIS — Z885 Allergy status to narcotic agent status: Secondary | ICD-10-CM

## 2012-06-21 DIAGNOSIS — Z9089 Acquired absence of other organs: Secondary | ICD-10-CM

## 2012-06-21 DIAGNOSIS — I8000 Phlebitis and thrombophlebitis of superficial vessels of unspecified lower extremity: Principal | ICD-10-CM | POA: Diagnosis present

## 2012-06-21 DIAGNOSIS — I2699 Other pulmonary embolism without acute cor pulmonale: Secondary | ICD-10-CM | POA: Diagnosis present

## 2012-06-21 DIAGNOSIS — F411 Generalized anxiety disorder: Secondary | ICD-10-CM | POA: Diagnosis present

## 2012-06-21 DIAGNOSIS — Z79899 Other long term (current) drug therapy: Secondary | ICD-10-CM

## 2012-06-21 DIAGNOSIS — I10 Essential (primary) hypertension: Secondary | ICD-10-CM | POA: Diagnosis present

## 2012-06-21 HISTORY — DX: Headache, unspecified: R51.9

## 2012-06-21 HISTORY — DX: Chronic sinusitis, unspecified: J32.9

## 2012-06-21 HISTORY — DX: Other chronic pain: G89.29

## 2012-06-21 HISTORY — DX: Headache: R51

## 2012-06-21 LAB — URINALYSIS, ROUTINE W REFLEX MICROSCOPIC
Ketones, ur: NEGATIVE mg/dL
Leukocytes, UA: NEGATIVE
Protein, ur: NEGATIVE mg/dL
Urobilinogen, UA: 0.2 mg/dL (ref 0.0–1.0)

## 2012-06-21 LAB — CBC WITH DIFFERENTIAL/PLATELET
Basophils Relative: 1 % (ref 0–1)
HCT: 47.8 % — ABNORMAL HIGH (ref 36.0–46.0)
Hemoglobin: 16.7 g/dL — ABNORMAL HIGH (ref 12.0–15.0)
Lymphs Abs: 1.7 10*3/uL (ref 0.7–4.0)
MCHC: 34.9 g/dL (ref 30.0–36.0)
Monocytes Absolute: 0.6 10*3/uL (ref 0.1–1.0)
Monocytes Relative: 10 % (ref 3–12)
Neutro Abs: 3.5 10*3/uL (ref 1.7–7.7)
RBC: 5.11 MIL/uL (ref 3.87–5.11)

## 2012-06-21 LAB — BLOOD GAS, ARTERIAL
Bicarbonate: 27.4 mEq/L — ABNORMAL HIGH (ref 20.0–24.0)
FIO2: 0.21 %
TCO2: 22.8 mmol/L (ref 0–100)
pCO2 arterial: 39.8 mmHg (ref 35.0–45.0)
pH, Arterial: 7.452 — ABNORMAL HIGH (ref 7.350–7.450)

## 2012-06-21 LAB — COMPREHENSIVE METABOLIC PANEL
Alkaline Phosphatase: 73 U/L (ref 39–117)
BUN: 12 mg/dL (ref 6–23)
CO2: 28 mEq/L (ref 19–32)
Chloride: 99 mEq/L (ref 96–112)
Creatinine, Ser: 0.76 mg/dL (ref 0.50–1.10)
GFR calc Af Amer: 83 mL/min — ABNORMAL LOW (ref >90)
GFR calc non Af Amer: 72 mL/min — ABNORMAL LOW (ref >90)
Glucose, Bld: 99 mg/dL (ref 70–99)
Total Bilirubin: 0.6 mg/dL (ref 0.3–1.2)

## 2012-06-21 LAB — GLUCOSE, CAPILLARY: Glucose-Capillary: 113 mg/dL — ABNORMAL HIGH (ref 70–99)

## 2012-06-21 LAB — LIPASE, BLOOD: Lipase: 22 U/L (ref 11–59)

## 2012-06-21 LAB — PROTIME-INR
INR: 0.97 (ref 0.00–1.49)
Prothrombin Time: 12.8 seconds (ref 11.6–15.2)

## 2012-06-21 MED ORDER — IOHEXOL 350 MG/ML SOLN
100.0000 mL | Freq: Once | INTRAVENOUS | Status: AC | PRN
  Administered 2012-06-21: 100 mL via INTRAVENOUS

## 2012-06-21 MED ORDER — SODIUM CHLORIDE 0.9 % IV SOLN: INTRAVENOUS | Status: DC

## 2012-06-21 MED ORDER — IBUPROFEN 400 MG PO TABS
200.0000 mg | ORAL_TABLET | Freq: Four times a day (QID) | ORAL | Status: DC | PRN
  Administered 2012-06-22 – 2012-06-24 (×3): 200 mg via ORAL
  Filled 2012-06-21 (×3): qty 1

## 2012-06-21 MED ORDER — PREDNISONE 20 MG PO TABS
20.0000 mg | ORAL_TABLET | Freq: Every day | ORAL | Status: DC
  Administered 2012-06-22 – 2012-06-24 (×3): 20 mg via ORAL
  Filled 2012-06-21 (×3): qty 1

## 2012-06-21 MED ORDER — SODIUM CHLORIDE 0.9 % IV BOLUS (SEPSIS): 500.0000 mL | Freq: Once | INTRAVENOUS | Status: DC

## 2012-06-21 MED ORDER — LOSARTAN POTASSIUM 50 MG PO TABS
50.0000 mg | ORAL_TABLET | Freq: Every day | ORAL | Status: DC
  Administered 2012-06-22 – 2012-06-24 (×3): 50 mg via ORAL
  Filled 2012-06-21 (×3): qty 1

## 2012-06-21 MED ORDER — ACETAMINOPHEN 325 MG PO TABS
650.0000 mg | ORAL_TABLET | Freq: Once | ORAL | Status: AC
  Administered 2012-06-21: 650 mg via ORAL
  Filled 2012-06-21: qty 2

## 2012-06-21 MED ORDER — HEPARIN (PORCINE) IN NACL 100-0.45 UNIT/ML-% IJ SOLN
1150.0000 [IU]/h | INTRAMUSCULAR | Status: DC
  Administered 2012-06-21 – 2012-06-22 (×2): 1300 [IU]/h via INTRAVENOUS
  Administered 2012-06-23: 1150 [IU]/h via INTRAVENOUS
  Filled 2012-06-21 (×2): qty 250

## 2012-06-21 MED ORDER — SODIUM CHLORIDE 0.9 % IV SOLN
INTRAVENOUS | Status: DC
  Administered 2012-06-21 – 2012-06-24 (×4): via INTRAVENOUS

## 2012-06-21 MED ORDER — SODIUM CHLORIDE 0.9 % IV SOLN
INTRAVENOUS | Status: AC
  Administered 2012-06-21: 21:00:00 via INTRAVENOUS

## 2012-06-21 MED ORDER — AMLODIPINE BESYLATE 5 MG PO TABS
5.0000 mg | ORAL_TABLET | Freq: Every day | ORAL | Status: DC
  Administered 2012-06-22 – 2012-06-24 (×3): 5 mg via ORAL
  Filled 2012-06-21 (×3): qty 1

## 2012-06-21 MED ORDER — HEPARIN BOLUS VIA INFUSION
4000.0000 [IU] | Freq: Once | INTRAVENOUS | Status: AC
  Administered 2012-06-21: 4000 [IU] via INTRAVENOUS

## 2012-06-21 NOTE — ED Notes (Signed)
Requests medication for headache, MD made aware.

## 2012-06-21 NOTE — ED Notes (Signed)
Up to bedside commode, O2 sats dropped to 83% on room air, placed on 2 1/2 liters O2 via nasal canula with improvement to 92%, MD made aware.

## 2012-06-21 NOTE — ED Notes (Signed)
Feels weak, recent adm at [facility] with  Vomiting..  Cough,  Urinary incontinence.  No NVD.

## 2012-06-21 NOTE — ED Provider Notes (Signed)
History     CSN: [id number]  Arrival date & time 06/21/12  1216   First MD Initiated Contact with Patient 06/21/12 1247      Chief Complaint  Patient presents with  . Fatigue     HPI Pt was seen at 1300.   Per pt and family, c/o gradual onset and persistence of constant generalized weakness and fatigue for the past 1 week.  Has been associated with intermittent episodes of N/V/D, which has since resolved.  Pt states she was admitted to [facility] for same last week, dx "virus."  States she continues to feel "weak" and "tired."  Denies CP/palpitations, no SOB/cough, no abd pain, no further N/V/D, no fevers, no focal motor weakness, no tingling/numbness in extremities.     Past Medical History  Diagnosis Date  . Hypertension   . Hypoglycemia     Past Surgical History  Procedure Laterality Date  . Appendectomy      History  Substance Use Topics  . Smoking status: Never Smoker   . Smokeless tobacco: Not on file  . Alcohol Use: No      Review of Systems ROS: Statement: All systems negative except as marked or noted in the HPI; Constitutional: Negative for fever and chills. +generalized weakness/fatigue. ; ; Eyes: Negative for eye pain, redness and discharge. ; ; ENMT: Negative for ear pain, hoarseness, nasal congestion, sinus pressure and sore throat. ; ; Cardiovascular: Negative for chest pain, palpitations, diaphoresis, dyspnea and peripheral edema. ; ; Respiratory: Negative for cough, wheezing and stridor. ; ; Gastrointestinal: Negative for nausea, vomiting, diarrhea, abdominal pain, blood in stool, hematemesis, jaundice and rectal bleeding. . ; ; Genitourinary: Negative for dysuria, flank pain and hematuria. ; ; Musculoskeletal: Negative for back pain and neck pain. Negative for swelling and trauma.; ; Skin: Negative for pruritus, rash, abrasions, blisters, bruising and skin lesion.; ; Neuro: Negative for headache, lightheadedness and neck stiffness. Negative for altered  level of consciousness , altered mental status, extremity weakness, paresthesias, involuntary movement, seizure and syncope.       Allergies  Codeine  Home Medications   Current Outpatient Rx  Name  Route  Sig  Dispense  Refill  . acetaminophen (TYLENOL) 500 MG tablet   Oral   Take 500 mg by mouth 3 (three) times daily.         Marland Kitchen ALPRAZolam (XANAX) 0.25 MG tablet   Oral   Take 0.25 mg by mouth 3 (three) times daily.         Marland Kitchen ALPRAZolam (XANAX) 0.25 MG tablet   Oral   Take 0.25 mg by mouth every 6 (six) hours as needed for anxiety.         Marland Kitchen aspirin EC 81 MG tablet   Oral   Take 81 mg by mouth daily.         . beta carotene w/minerals (OCUVITE) tablet   Oral   Take 2 tablets by mouth daily.         Marland Kitchen buPROPion (WELLBUTRIN XL) 150 MG 24 hr tablet   Oral   Take 150 mg by mouth daily.         . calcium-vitamin D (OSCAL 500/200 D-3) 500-200 MG-UNIT per tablet   Oral   Take 1 tablet by mouth 3 (three) times daily.         . digoxin (LANOXIN) 0.125 MG tablet   Oral   Take 0.125 mg by mouth daily.         Marland Kitchen  diltiazem (TIAZAC) 360 MG 24 hr capsule   Oral   Take 360 mg by mouth daily.         . feeding supplement (RESOURCE BREEZE) LIQD   Oral   Take 1 Container by mouth 3 (three) times daily between meals.         . fentaNYL (DURAGESIC - DOSED MCG/HR) 12 MCG/HR   Transdermal   Place 1 patch onto the skin every 3 (three) days.         . haloperidol (HALDOL) 0.5 MG tablet   Oral   Take 0.5 mg by mouth 2 (two) times daily.         Marland Kitchen HYDROcodone-acetaminophen (NORCO/VICODIN) 5-325 MG per tablet   Oral   Take 1 tablet by mouth 2 (two) times daily.         . Multiple Vitamins-Iron (MULTIVITAMINS WITH IRON) TABS   Oral   Take 1 tablet by mouth daily.         . Olopatadine HCl (PATADAY) 0.2 % SOLN   Both Eyes   Place 1 drop into both eyes daily.         Marland Kitchen omeprazole (PRILOSEC) 20 MG capsule   Oral   Take 20 mg by mouth daily.          . OXcarbazepine (TRILEPTAL) 150 MG tablet   Oral   Take 150 mg by mouth 2 (two) times daily.         Bertram Gala Glycol-Propyl Glycol (SYSTANE OP)   Ophthalmic   Apply 1 drop to eye 2 (two) times daily.         . QUEtiapine (SEROQUEL) 25 MG tablet   Oral   Take 12.5 mg by mouth 2 (two) times daily.         . sennosides-docusate sodium (SENOKOT-S) 8.6-50 MG tablet   Oral   Take 2 tablets by mouth at bedtime.         . Vitamin D, Ergocalciferol, (DRISDOL) 50000 UNITS CAPS   Oral   Take 50,000 Units by mouth every 30 (thirty) days.           BP 128/59  Pulse 105  Temp(Src) 98.2 F (36.8 C) (Oral)  Resp 16  Ht 5\' 2"  (1.575 m)  Wt 150 lb (68.04 kg)  BMI 27.43 kg/m2  SpO2 95%  Physical Exam 1305: Physical examination:  Nursing notes reviewed; Vital signs and O2 SAT reviewed;  Constitutional: Well developed, Well nourished, In no acute distress; Head:  Normocephalic, atraumatic; Eyes: EOMI, PERRL, No scleral icterus; ENMT: Mouth and pharynx normal, Mucous membranes dry; Neck: Supple, Full range of motion, No lymphadenopathy; Cardiovascular: Regular rate and rhythm, occasionally ectopy, No gallop; Respiratory: Breath sounds clear & equal bilaterally, No rales, rhonchi, wheezes.  Speaking full sentences with ease, Normal respiratory effort/excursion; Chest: Nontender, Movement normal; Abdomen: Soft, Nontender, Nondistended, Normal bowel sounds;; Extremities: Pulses normal, No tenderness, 1+ pedal edema bilat without calf asymmetry.; Neuro: AA&Ox3, poor historian. Major CN grossly intact. No facial droop. Speech clear. No gross focal motor or sensory deficits in extremities.; Skin: Color normal, Warm, Dry.   ED Course  Procedures     MDM  MDM Reviewed: previous chart, nursing note and vitals Reviewed previous: labs Interpretation: labs, ECG, x-ray and CT scan Total time providing critical care: 30-74 minutes. This excludes time spent performing separately  reportable procedures and services. Consults: admitting MD    CRITICAL CARE Performed by: Laray Anger Total critical care time: 35 Critical care time  was exclusive of separately billable procedures and treating other patients. Critical care was necessary to treat or prevent imminent or life-threatening deterioration. Critical care was time spent personally by me on the following activities: development of treatment plan with patient and/or surrogate as well as nursing, discussions with consultants, evaluation of patient's response to treatment, examination of patient, obtaining history from patient or surrogate, ordering and performing treatments and interventions, ordering and review of laboratory studies, ordering and review of radiographic studies, pulse oximetry and re-evaluation of patient's condition.    Date: 06/21/2012  Rate: 92  Rhythm: normal sinus rhythm and premature atrial contractions (PAC)  QRS Axis: left  Intervals: normal  ST/T Wave abnormalities: nonspecific T wave changes  Conduction Disutrbances:none  Narrative Interpretation:   Old EKG Reviewed: unchanged; no significant changes from previous EKG dated 04/28/2009.   Results for orders placed during the hospital encounter of 06/21/12  CBC WITH DIFFERENTIAL      Result Value Range   WBC 6.0  4.0 - 10.5 K/uL   RBC 5.11  3.87 - 5.11 MIL/uL   Hemoglobin 16.7 (*) 12.0 - 15.0 g/dL   HCT 16.1 (*) 09.6 - 04.5 %   MCV 93.5  78.0 - 100.0 fL   MCH 32.7  26.0 - 34.0 pg   MCHC 34.9  30.0 - 36.0 g/dL   RDW 40.9  81.1 - 91.4 %   Platelets 154  150 - 400 K/uL   Neutrophils Relative 58  43 - 77 %   Neutro Abs 3.5  1.7 - 7.7 K/uL   Lymphocytes Relative 28  12 - 46 %   Lymphs Abs 1.7  0.7 - 4.0 K/uL   Monocytes Relative 10  3 - 12 %   Monocytes Absolute 0.6  0.1 - 1.0 K/uL   Eosinophils Relative 4  0 - 5 %   Eosinophils Absolute 0.2  0.0 - 0.7 K/uL   Basophils Relative 1  0 - 1 %   Basophils Absolute 0.0  0.0 - 0.1  K/uL  COMPREHENSIVE METABOLIC PANEL      Result Value Range   Sodium 138  135 - 145 mEq/L   Potassium 3.4 (*) 3.5 - 5.1 mEq/L   Chloride 99  96 - 112 mEq/L   CO2 28  19 - 32 mEq/L   Glucose, Bld 99  70 - 99 mg/dL   BUN 12  6 - 23 mg/dL   Creatinine, Ser 7.82  0.50 - 1.10 mg/dL   Calcium 8.9  8.4 - 95.6 mg/dL   Total Protein 6.2  6.0 - 8.3 g/dL   Albumin 3.7  3.5 - 5.2 g/dL   AST 23  0 - 37 U/L   ALT 27  0 - 35 U/L   Alkaline Phosphatase 73  39 - 117 U/L   Total Bilirubin 0.6  0.3 - 1.2 mg/dL   GFR calc non Af Amer 72 (*) >90 mL/min   GFR calc Af Amer 83 (*) >90 mL/min  URINALYSIS, ROUTINE W REFLEX MICROSCOPIC      Result Value Range   Color, Urine YELLOW  YELLOW   APPearance CLEAR  CLEAR   Specific Gravity, Urine 1.010  1.005 - 1.030   pH 6.5  5.0 - 8.0   Glucose, UA NEGATIVE  NEGATIVE mg/dL   Hgb urine dipstick NEGATIVE  NEGATIVE   Bilirubin Urine NEGATIVE  NEGATIVE   Ketones, ur NEGATIVE  NEGATIVE mg/dL   Protein, ur NEGATIVE  NEGATIVE mg/dL   Urobilinogen, UA  0.2  0.0 - 1.0 mg/dL   Nitrite NEGATIVE  NEGATIVE   Leukocytes, UA NEGATIVE  NEGATIVE  LIPASE, BLOOD      Result Value Range   Lipase 22  11 - 59 U/L  TROPONIN I      Result Value Range   Troponin I <0.30  <0.30 ng/mL  LACTIC ACID, PLASMA      Result Value Range   Lactic Acid, Venous 0.9  0.5 - 2.2 mmol/L  D-DIMER, QUANTITATIVE      Result Value Range   D-Dimer, Quant 2.27 (*) 0.00 - 0.48 ug/mL-FEU  BLOOD GAS, ARTERIAL      Result Value Range   FIO2 0.21     pH, Arterial 7.452 (*) 7.350 - 7.450   pCO2 arterial 39.8  35.0 - 45.0 mmHg   pO2, Arterial 66.5 (*) 80.0 - 100.0 mmHg   Bicarbonate 27.4 (*) 20.0 - 24.0 mEq/L   TCO2 22.8  0 - 100 mmol/L   Acid-Base Excess 3.6 (*) 0.0 - 2.0 mmol/L   O2 Saturation 93.8     Patient temperature 37.0     Collection site RIGHT RADIAL     Drawn by Seward Meth, RRT,RCP     Sample type ARTERIAL DRAW     Allens test (pass/fail) PASS  PASS   Dg Abd Acute  W/chest 06/21/2012  *RADIOLOGY REPORT*  Clinical Data: Abdominal pain, nausea/vomiting/diarrhea  ACUTE ABDOMEN SERIES (ABDOMEN 2 VIEW & CHEST 1 VIEW)  Comparison: Chest radiographs dated 10/17/2008  Findings: Chronic interstitial markings.  No focal consolidation. Mild scarring in the right mid lung.  No pleural effusion or pneumothorax.  Cardiomegaly.  Nonobstructive bowel gas pattern.  No evidence of free air under the diaphragm on the upright view.  Degenerative changes of the visualized thoracolumbar spine.  IMPRESSION: No evidence of acute cardiopulmonary disease.  No evidence of small bowel obstruction or free air.   Original Report Authenticated By: Charline Bills, M.D.     1500:  Pt orthostatic with SBP dropping to 120's from 160's lay to stand.  Pt also became tachycardic and her O2 Sats dropped to 90% from 95% R/A.  Pt ambulated in the ED with assist x1, O2 Sats dropping to 89% R/A.  Pt's son concerned regarding pt's recent acute flair of her chronic headache for the past several days and is requesting a "CT scan to make sure she doesn't have another sinus infection or something else." Family insistent. ABG with PO2, increased A-a gradient for age.  D-dimer elevated; will obtain CT-A chest.     Ct Head Wo Contrast 06/21/2012  *RADIOLOGY REPORT*  Clinical Data: Headache.  Weakness.  Fatigue.  History of chronic sinusitis.  CT HEAD WITHOUT CONTRAST  Technique:  Contiguous axial images were obtained from the base of the skull through the vertex without contrast.  Comparison: 07/10/2008.  Findings: Mildly progressive patchy white matter low density in both cerebral hemispheres.  No significant change in mild enlargement of the ventricles and subarachnoid spaces.  No intracranial hemorrhage, mass lesion or CT evidence of acute infarction.  Minimal bilateral maxillary sinus mucosal thickening, within normal limits of physiological thickening.  IMPRESSION:  1.  No acute abnormality. 2.  Mildly progressive  chronic small vessel white matter ischemic changes in both cerebral hemispheres. 3.  Stable mild atrophy.   Original Report Authenticated By: Beckie Salts, M.D.    Ct Angio Chest Pe W/cm &/or Wo Cm 06/21/2012  *RADIOLOGY REPORT*  Clinical Data: Short of breath.  CT  ANGIOGRAPHY CHEST  Technique:  Multidetector CT imaging of the chest using the standard protocol during bolus administration of intravenous contrast. Multiplanar reconstructed images including MIPs were obtained and reviewed to evaluate the vascular anatomy.  Contrast: OMNIPAQUE IOHEXOL 350 MG/ML SOLN  Comparison: 06/21/2012  Findings: Filling defect in the right lower lobe pulmonary artery compatible with mild pulmonary embolus.  No other emboli are identified.  Heart size is mildly enlarged.  Mild coronary calcification. Thoracic aorta is not dilated.  There is calcification in the thoracic aorta.  Irregular shaped density right upper lobe measures 12 mm on axial image 25.  Medial to this  are small well defined nodules.    Left lung is clear.  No pleural effusion is present.  Upper abdomen shows no focal abnormality.  IMPRESSION: Mild pulmonary embolism right lower lobe.  12 mm right upper lobe irregular density with small adjacent nodules.  Initial follow-up by chest CT without contrast is recommended in 3 months to confirm persistence.   This recommendation follows the consensus statement: Recommendations for the Management of Subsolid Pulmonary Nodules Detected at CT:  A Statement from the Fleischner Society as published in Radiology 2013; 266:304-317.  I discussed the findings by telephone with Dr. Clarene Duke.   Original Report Authenticated By: Janeece Riggers, M.D.     1710:  +PE on CT scan.  Will start IV heparin.  Dx and testing d/w pt and family.  Questions answered.  Verb understanding, agreeable to admit.  T/C to Dr. Renard Matter, case discussed, including:  HPI, pertinent PM/SHx, VS/PE, dx testing, ED course and treatment:  Agreeable to  admit, requests to write temporary orders, obtain tele bed.       Laray Anger, DO 06/24/12 1156

## 2012-06-21 NOTE — ED Notes (Signed)
Respiratory therapy paged for ABG collection.

## 2012-06-21 NOTE — ED Notes (Signed)
Orthostatic Vital signs were completed on pt. Pt needed minimal assistance while standing. PT o2 did go from 98% sitting to 90% while standing. Pt stated she did not feel short of breath. MD made aware.

## 2012-06-21 NOTE — Progress Notes (Signed)
ANTICOAGULATION CONSULT NOTE - Initial Consult  Pharmacy Consult for Heparin Indication: pulmonary embolus  Allergies  Allergen Reactions  . Codeine Nausea And Vomiting   Patient Measurements: Height: 5\' 2"  (157.5 cm) Weight: 150 lb (68.04 kg) IBW/kg (Calculated) : 50.1  Vital Signs: Temp: 98.2 F (36.8 C) (03/17 1219) Temp src: Oral (03/17 1219) BP: 165/78 mmHg (03/17 1400) Pulse Rate: 92 (03/17 1545)  Labs:  Recent Labs  06/21/12 1326  HGB 16.7*  HCT 47.8*  PLT 154  CREATININE 0.76  TROPONINI <0.30   Estimated Creatinine Clearance: 41.4 ml/min (by C-G formula based on Cr of 0.76).  Medical History: Past Medical History  Diagnosis Date  . Hypertension   . Hypoglycemia   . Chronic headache   . Sinusitis    Medications:  Infusions:  . sodium chloride 75 mL/hr at 06/21/12 1332  . heparin    . heparin    . [DISCONTINUED] sodium chloride    . [DISCONTINUED] sodium chloride      Assessment: 79yo female in ED.  Pharmacy asked to initiate and manage IV Heparin for PE. Goal of Therapy:  Heparin level 0.3-0.7 units/ml Monitor platelets by anticoagulation protocol: Yes   Plan: Heparin 4000 unit bolus then 19 units/Kg/hour Heparin level in 6 hours then daily CBC daily while on heparin  Margo Aye, Kimia Finan A 06/21/2012,4:58 PM

## 2012-06-22 ENCOUNTER — Observation Stay (HOSPITAL_COMMUNITY): Payer: Medicare Other

## 2012-06-22 LAB — URINE CULTURE: Colony Count: NO GROWTH

## 2012-06-22 LAB — HEPARIN LEVEL (UNFRACTIONATED): Heparin Unfractionated: 0.56 IU/mL (ref 0.30–0.70)

## 2012-06-22 LAB — CBC
Hemoglobin: 16.7 g/dL — ABNORMAL HIGH (ref 12.0–15.0)
Platelets: 155 10*3/uL (ref 150–400)
RBC: 5.18 MIL/uL — ABNORMAL HIGH (ref 3.87–5.11)
WBC: 5.4 10*3/uL (ref 4.0–10.5)

## 2012-06-22 NOTE — H&P (Signed)
[name], [name]               ACCOUNT NO.:  [id number]  MEDICAL RECORD NO.:  [id number]  LOCATION:  A322                          FACILITY:  [facility]  PHYSICIAN:  Analynn Daum G. Renard Matter, MD   DATE OF BIRTH:  1920/04/01  DATE OF ADMISSION:  06/21/2012 DATE OF DISCHARGE:  LH                             HISTORY & PHYSICAL   HISTORY OF PRESENT ILLNESS:  The patient is 79 year old.  She was admitted through the emergency department with chief complaint being fatigue and weakness.  Apparently, the patient has had chronic hypertension, which has been fairly well controlled.  She apparently developed fatigue and weakness, which had been present for approximately 1 week, with episodes of nausea and vomiting, which had resolved.  She had been seen and admitted to [facility] last week for a virus infection continued to be weak and tired.  She was evaluated by ED physician.  CBC was essentially normal as was chemistries with the exception of slightly low serum potassium of 3.4.  Urinalysis was essentially negative.  The patient did have CT of the head which showed no acute abnormality.  Mild progressive chronic small vessel ischemic changes, stable mild atrophy.  A CT angio of the chest was ordered, which showed mild pulmonary embolus right lower lobe.  Radiologist recommended a followup CT chest without contrast in 3 months because of 12-mm right upper lobe irregular density.  The patient was given a bolus of heparin and started on IV fluids and continued the heparin drip and was subsequently admitted.  SOCIAL HISTORY:  The patient does not smoke or use alcohol.  PAST MEDICAL HISTORY:  The patient has had episode of hypoglycemia, hypertension for which she has received medications.  PAST SURGICAL HISTORY:  Previous appendectomy.  ALLERGIES:  To codeine.  REVIEW OF SYSTEMS:  HEENT:  Negative.  CARDIOPULMONARY:  No chest pain or dyspnea.  GI:  Recent episode of nausea and vomiting, but  no symptoms within the last couple of days.  GU:  No dysuria, hematuria.  PHYSICAL EXAMINATION:  VITAL SIGNS:  Blood pressure 148/88, respirations 22, pulse 97, temp 98.6. GENERAL:  The patient is alert and oriented.  HEENT:  Eyes, PERRLA.  TM: Negative. NECK:  Supple.  No JVD or thyroid abnormalities. LUNGS:  Clear to P and A. HEART:  Regular rhythm.  No murmurs. ABDOMEN:  No palpable organs or masses. EXTREMITIES:  Free of edema.  The patient does have evidence of enlarged veins medial to the left thigh.  Veins have firm consistency. NEUROLOGIC:  No cranial nerve abnormalities.  No sensory or motor abnormalities.  ASSESSMENT: 1. The patient was admitted with what was felt to be pulmonary     embolization, right lower lobe.  She does have evidence of     varicosities in both legs, may have possible embolus from this     source. 2. Hypertension.  PLAN:  To keep the patient on heparin drip and further evaluate the veins in her legs.  Continue previous medications.  MEDICATION LIST: 1. Acetaminophen 500 mg t.i.d. 2. Alprazolam 0.25 mg b.i.d. 3. Aspirin 81 mg daily. 4. Wellbutrin 150 mg daily. 5. Calcium. 6. Vitamin D 1 t.i.d.  7. Digoxin 0.125 mg daily. 8. Tiazac 360 mg daily. 9. Haldol 0.5 mg b.i.d. 10.Norco 5/325 mg twice daily. 11.Multivitamin daily. 12.Senokot 8.6/50 b.i.d.     Kiffany Schelling G. Renard Matter, MD     AGM/MEDQ  D:  06/21/2012  T:  06/22/2012  Job:  [id number]

## 2012-06-22 NOTE — Progress Notes (Signed)
Subjective: This patient was admitted with weakness. She was noted by CT of the chest to have mild pulmonary embolus and right lower lobe. She was started on IV heparin and remains on IV heparin. She is relatively asymptomatic has minimal pain.  Objective: Vital signs in last 24 hours: Temp:  [98.2 F (36.8 C)-98.6 F (37 C)] 98.6 F (37 C) (03/18 0429) Pulse Rate:  [88-105] 95 (03/18 0429) Resp:  [16-23] 22 (03/18 0429) BP: (128-192)/(59-100) 142/80 mmHg (03/18 0628) SpO2:  [89 %-98 %] 95 % (03/18 0429) Weight:  [68.04 kg (150 lb)-73.936 kg (163 lb)] 73.936 kg (163 lb) (03/17 2022) Weight change:  Last BM Date: 06/21/12  Intake/Output from previous day: 03/17 0701 - 03/18 0700 In: 480 [P.O.:480] Out: -  Intake/Output this shift:    Physical Exam: Patient is alert and oriented  Vital signs and blood pressure 142/80 respiration 22 pulse 95 temp 98.6  HEENT negative  Lungs clear to P&A  Heart regular rhythm no murmurs  Abdomen no palpable organs or masses extremities patient does have dilated veins in the medial part of the left thigh   Recent Labs  06/21/12 1326 06/22/12 0457  WBC 6.0 5.4  HGB 16.7* 16.7*  HCT 47.8* 48.5*  PLT 154 155   BMET  Recent Labs  06/21/12 1326  NA 138  K 3.4*  CL 99  CO2 28  GLUCOSE 99  BUN 12  CREATININE 0.76  CALCIUM 8.9    Studies/Results: Ct Head Wo Contrast  06/21/2012  *RADIOLOGY REPORT*  Clinical Data: Headache.  Weakness.  Fatigue.  History of chronic sinusitis.  CT HEAD WITHOUT CONTRAST  Technique:  Contiguous axial images were obtained from the base of the skull through the vertex without contrast.  Comparison: 07/10/2008.  Findings: Mildly progressive patchy white matter low density in both cerebral hemispheres.  No significant change in mild enlargement of the ventricles and subarachnoid spaces.  No intracranial hemorrhage, mass lesion or CT evidence of acute infarction.  Minimal bilateral maxillary sinus mucosal  thickening, within normal limits of physiological thickening.  IMPRESSION:  1.  No acute abnormality. 2.  Mildly progressive chronic small vessel white matter ischemic changes in both cerebral hemispheres. 3.  Stable mild atrophy.   Original Report Authenticated By: Beckie Salts, M.D.    Ct Angio Chest Pe W/cm &/or Wo Cm  06/21/2012  *RADIOLOGY REPORT*  Clinical Data: Short of breath.  CT ANGIOGRAPHY CHEST  Technique:  Multidetector CT imaging of the chest using the standard protocol during bolus administration of intravenous contrast. Multiplanar reconstructed images including MIPs were obtained and reviewed to evaluate the vascular anatomy.  Contrast: OMNIPAQUE IOHEXOL 350 MG/ML SOLN  Comparison: 06/21/2012  Findings: Filling defect in the right lower lobe pulmonary artery compatible with mild pulmonary embolus.  No other emboli are identified.  Heart size is mildly enlarged.  Mild coronary calcification. Thoracic aorta is not dilated.  There is calcification in the thoracic aorta.  Irregular shaped density right upper lobe measures 12 mm on axial image 25.  Medial to this  are small well defined nodules.    Left lung is clear.  No pleural effusion is present.  Upper abdomen shows no focal abnormality.  IMPRESSION: Mild pulmonary embolism right lower lobe.  12 mm right upper lobe irregular density with small adjacent nodules.  Initial follow-up by chest CT without contrast is recommended in 3 months to confirm persistence.   This recommendation follows the consensus statement: Recommendations for the Management of  Subsolid Pulmonary Nodules Detected at CT:  A Statement from the Fleischner Society as published in Radiology 2013; 266:304-317.  I discussed the findings by telephone with Dr. Clarene Duke.   Original Report Authenticated By: Janeece Riggers, M.D.    Dg Abd Acute W/chest  06/21/2012  *RADIOLOGY REPORT*  Clinical Data: Abdominal pain, nausea/vomiting/diarrhea  ACUTE ABDOMEN SERIES (ABDOMEN 2 VIEW &  CHEST 1 VIEW)  Comparison: Chest radiographs dated 10/17/2008  Findings: Chronic interstitial markings.  No focal consolidation. Mild scarring in the right mid lung.  No pleural effusion or pneumothorax.  Cardiomegaly.  Nonobstructive bowel gas pattern.  No evidence of free air under the diaphragm on the upright view.  Degenerative changes of the visualized thoracolumbar spine.  IMPRESSION: No evidence of acute cardiopulmonary disease.  No evidence of small bowel obstruction or free air.   Original Report Authenticated By: Charline Bills, M.D.     Medications:  . sodium chloride   Intravenous STAT  . amLODipine  5 mg Oral Daily  . losartan  50 mg Oral Daily  . predniSONE  20 mg Oral Daily    . sodium chloride 75 mL/hr at 06/21/12 1332  . heparin 1,300 Units/hr (06/21/12 1747)     Assessment/Plan: The patient was admitted with pulmonary embolus right lower lobe. She does have varicosities in both legs and possible embolus from the source.  Hypertension  Plan to continue IV heparin will obtain venous Doppler ultrasound today of both legs.   LOS: 1 day   Zhoe Catania G 06/22/2012, 7:12 AM

## 2012-06-22 NOTE — Progress Notes (Signed)
UR Chart Review Completed  

## 2012-06-22 NOTE — Progress Notes (Signed)
ANTICOAGULATION CONSULT NOTE   Pharmacy Consult for Heparin Indication: pulmonary embolus  Allergies  Allergen Reactions  . Codeine Nausea And Vomiting   Patient Measurements: Height: 5\' 2"  (157.5 cm) Weight: 163 lb (73.936 kg) IBW/kg (Calculated) : 50.1  Vital Signs: Temp: 98.6 F (37 C) (03/18 0429) Temp src: Oral (03/18 0429) BP: 142/80 mmHg (03/18 0628) Pulse Rate: 95 (03/18 0429)  Labs:  Recent Labs  06/21/12 1326 06/21/12 1649 06/21/12 2132 06/22/12 0457  HGB 16.7*  --   --  16.7*  HCT 47.8*  --   --  48.5*  PLT 154  --   --  155  APTT  --  30  --   --   LABPROT  --  12.8  --   --   INR  --  0.97  --   --   HEPARINUNFRC  --   --  0.73* 0.56  CREATININE 0.76  --   --   --   TROPONINI <0.30  --   --   --    Estimated Creatinine Clearance: 43.1 ml/min (by C-G formula based on Cr of 0.76).  Medical History: Past Medical History  Diagnosis Date  . Hypertension   . Hypoglycemia   . Chronic headache   . Sinusitis    Medications:  Infusions:  . sodium chloride 75 mL/hr at 06/21/12 1332  . heparin 1,300 Units/hr (06/21/12 1747)  . [DISCONTINUED] sodium chloride     Assessment: 79yo female in ED.  Pharmacy asked to initiate and manage IV Heparin for PE.  Heparin level is therapeutic. Goal of Therapy:  Heparin level 0.3-0.7 units/ml Monitor platelets by anticoagulation protocol: Yes   Plan: Continue Heparin at 1300 units/hr Heparin level daily CBC daily while on heparin  [name] A 06/22/2012,8:08 AM

## 2012-06-22 NOTE — Care Management Note (Signed)
    Page 1 of 2   06/24/2012     2:39:12 PM   CARE MANAGEMENT NOTE 06/24/2012  Patient:  [name], [name]   Account Number:  [id number]  Date Initiated:  06/22/2012  Documentation initiated by:  Sharrie Rothman  Subjective/Objective Assessment:   Pt admitted from home with SOB and PE. Pt has 2 sons who live with her and they are very active in the care of the pt. Pt is fairly independent with ADL's.     Action/Plan:   Pt has requested HH RN at discharge. Cm will continue to follow and arrange [facility] with pts choice of HH agencies.   Anticipated DC Date:  06/25/2012   Anticipated DC Plan:  HOME W HOME HEALTH SERVICES      DC Planning Services  CM consult      [facility] Choice  HOME HEALTH   Choice offered to / List presented to:  C-1 Patient        HH arranged  HH-1 RN      [facility] agency  [facility]   Status of service:  Completed, signed off Medicare Important Message given?  YES (If response is "NO", the following Medicare IM given date fields will be blank) Date Medicare IM given:  06/24/2012 Date Additional Medicare IM given:    Discharge Disposition:  HOME W HOME HEALTH SERVICES  Per UR Regulation:    If discussed at Long Length of Stay Meetings, dates discussed:    Comments:  06/24/12 1440 Arlyss Queen, RN BSN CM CM spoke with pts son Onalee Hua about discharge arrangements. They have agreed to [facility] with [facility] for RN. Copy of private duty agency left for son for possible need for CNA in the home in the future. They have declined PT and aide with HH at this time. No DME needs at this time since pt has cane and walker at home. Pt and pts nurse aware of discharge arrangements  06/22/12 1422 Arlyss Queen, RN BSN CM

## 2012-06-23 ENCOUNTER — Inpatient Hospital Stay (HOSPITAL_COMMUNITY): Payer: Medicare Other

## 2012-06-23 LAB — BASIC METABOLIC PANEL
CO2: 29 mEq/L (ref 19–32)
CO2: 28 mEq/L (ref 19–32)
Calcium: 8.2 mg/dL — ABNORMAL LOW (ref 8.4–10.5)
Chloride: 104 mEq/L (ref 96–112)
GFR calc non Af Amer: 75 mL/min — ABNORMAL LOW (ref >90)
Glucose, Bld: 127 mg/dL — ABNORMAL HIGH (ref 70–99)
Potassium: 3.7 mEq/L (ref 3.5–5.1)
Sodium: 141 mEq/L (ref 135–145)
Sodium: 140 mEq/L (ref 135–145)

## 2012-06-23 LAB — CBC
Hemoglobin: 16.3 g/dL — ABNORMAL HIGH (ref 12.0–15.0)
RBC: 5 MIL/uL (ref 3.87–5.11)

## 2012-06-23 LAB — HEPARIN LEVEL (UNFRACTIONATED): Heparin Unfractionated: 0.83 IU/mL — ABNORMAL HIGH (ref 0.30–0.70)

## 2012-06-23 MED ORDER — ALPRAZOLAM 0.25 MG PO TABS
0.2500 mg | ORAL_TABLET | Freq: Four times a day (QID) | ORAL | Status: DC | PRN
  Administered 2012-06-23 – 2012-06-24 (×3): 0.25 mg via ORAL
  Filled 2012-06-23 (×3): qty 1

## 2012-06-23 MED ORDER — TEMAZEPAM 15 MG PO CAPS
15.0000 mg | ORAL_CAPSULE | Freq: Every evening | ORAL | Status: DC | PRN
  Administered 2012-06-23 (×2): 15 mg via ORAL
  Filled 2012-06-23 (×2): qty 1

## 2012-06-23 MED ORDER — RIVAROXABAN 10 MG PO TABS: 20.0000 mg | ORAL_TABLET | Freq: Every day | ORAL | Status: DC

## 2012-06-23 MED ORDER — RIVAROXABAN 10 MG PO TABS
15.0000 mg | ORAL_TABLET | Freq: Two times a day (BID) | ORAL | Status: DC
  Administered 2012-06-23 – 2012-06-24 (×2): 15 mg via ORAL
  Filled 2012-06-23 (×2): qty 2

## 2012-06-23 MED ORDER — HEPARIN (PORCINE) IN NACL 100-0.45 UNIT/ML-% IJ SOLN
INTRAMUSCULAR | Status: AC
  Filled 2012-06-23: qty 250

## 2012-06-23 MED ORDER — POTASSIUM CHLORIDE 10 MEQ/100ML IV SOLN
10.0000 meq | INTRAVENOUS | Status: AC
  Administered 2012-06-23 (×3): 10 meq via INTRAVENOUS
  Filled 2012-06-23: qty 300

## 2012-06-23 MED ORDER — RIVAROXABAN 10 MG PO TABS
20.0000 mg | ORAL_TABLET | Freq: Every day | ORAL | Status: DC
  Administered 2012-06-23: 20 mg via ORAL
  Filled 2012-06-23: qty 2

## 2012-06-23 NOTE — Progress Notes (Signed)
Subjective: The patient is fairly comfortable as morning. She continues to have some weakness. She was found to have evidence of pulmonary embolization right lower lobe. She did have venous Doppler ultrasound of her right leg yesterday which was negative.  Objective: Vital signs in last 24 hours: Temp:  [97.6 F (36.4 C)-98.5 F (36.9 C)] 97.6 F (36.4 C) (03/19 0449) Pulse Rate:  [92-105] 92 (03/19 0449) Resp:  [18-20] 18 (03/19 0449) BP: (129-179)/(80-89) 179/89 mmHg (03/19 0449) SpO2:  [91 %-96 %] 96 % (03/19 0449) Weight change:  Last BM Date: 06/22/12  Intake/Output from previous day: 03/18 0701 - 03/19 0700 In: 3338.8 [P.O.:360; I.V.:2978.8] Out: -  Intake/Output this shift: Total I/O In: 2978.8 [I.V.:2978.8] Out: -   Physical Exam: The patient is alert and oriented  Vital signs blood pressure 179/89 respiration 18 pulse 92 temp 97.6 H. EENT negative  Lungs clear to P&A  Heart regular rhythm no murmurs  Abdomen no palpable organs or masses extremities patient does have dilated veins in medial part of left thigh.    Recent Labs  06/21/12 1326 06/22/12 0457  WBC 6.0 5.4  HGB 16.7* 16.7*  HCT 47.8* 48.5*  PLT 154 155   BMET  Recent Labs  06/21/12 1326  NA 138  K 3.4*  CL 99  CO2 28  GLUCOSE 99  BUN 12  CREATININE 0.76  CALCIUM 8.9    Studies/Results: Ct Head Wo Contrast  06/21/2012  *RADIOLOGY REPORT*  Clinical Data: Headache.  Weakness.  Fatigue.  History of chronic sinusitis.  CT HEAD WITHOUT CONTRAST  Technique:  Contiguous axial images were obtained from the base of the skull through the vertex without contrast.  Comparison: 07/10/2008.  Findings: Mildly progressive patchy white matter low density in both cerebral hemispheres.  No significant change in mild enlargement of the ventricles and subarachnoid spaces.  No intracranial hemorrhage, mass lesion or CT evidence of acute infarction.  Minimal bilateral maxillary sinus mucosal thickening,  within normal limits of physiological thickening.  IMPRESSION:  1.  No acute abnormality. 2.  Mildly progressive chronic small vessel white matter ischemic changes in both cerebral hemispheres. 3.  Stable mild atrophy.   Original Report Authenticated By: Beckie Salts, M.D.    Ct Angio Chest Pe W/cm &/or Wo Cm  06/21/2012  *RADIOLOGY REPORT*  Clinical Data: Short of breath.  CT ANGIOGRAPHY CHEST  Technique:  Multidetector CT imaging of the chest using the standard protocol during bolus administration of intravenous contrast. Multiplanar reconstructed images including MIPs were obtained and reviewed to evaluate the vascular anatomy.  Contrast: OMNIPAQUE IOHEXOL 350 MG/ML SOLN  Comparison: 06/21/2012  Findings: Filling defect in the right lower lobe pulmonary artery compatible with mild pulmonary embolus.  No other emboli are identified.  Heart size is mildly enlarged.  Mild coronary calcification. Thoracic aorta is not dilated.  There is calcification in the thoracic aorta.  Irregular shaped density right upper lobe measures 12 mm on axial image 25.  Medial to this  are small well defined nodules.    Left lung is clear.  No pleural effusion is present.  Upper abdomen shows no focal abnormality.  IMPRESSION: Mild pulmonary embolism right lower lobe.  12 mm right upper lobe irregular density with small adjacent nodules.  Initial follow-up by chest CT without contrast is recommended in 3 months to confirm persistence.   This recommendation follows the consensus statement: Recommendations for the Management of Subsolid Pulmonary Nodules Detected at CT:  A Statement from the  Fleischner Society as published in Radiology 2013; 266:304-317.  I discussed the findings by telephone with Dr. Clarene Duke.   Original Report Authenticated By: Janeece Riggers, M.D.    US Venous Img Lower Unilateral Right  06/22/2012  *RADIOLOGY REPORT*  Clinical Data: History of pulmonary embolism, evaluate for DVT.  RIGHT LOWER EXTREMITY VENOUS  DUPLEX ULTRASOUND  Technique:  Gray-scale sonography with graded compression, as well as color Doppler and duplex ultrasound were performed to evaluate the deep venous system of the lower extremity from the level of the common femoral vein through the popliteal and proximal calf veins. Spectral Doppler was utilized to evaluate flow at rest and with distal augmentation maneuvers.  Comparison:  None.  Findings:  Normal compressibility of the common femoral, superficial femoral, and popliteal veins is demonstrated, as well as the visualized proximal calf veins.  No filling defects to suggest DVT on grayscale or color Doppler imaging.  Doppler waveforms show normal direction of venous flow, normal respiratory phasicity and response to augmentation.  IMPRESSION: No evidence of deep vein thrombosis within the right lower extremity.  Note, bilateral venous Doppler ultrasound were ordered, however the patient refused evaluation of the left lower extremity.   Original Report Authenticated By: Tacey Ruiz, MD    Dg Abd Acute W/chest  06/21/2012  *RADIOLOGY REPORT*  Clinical Data: Abdominal pain, nausea/vomiting/diarrhea  ACUTE ABDOMEN SERIES (ABDOMEN 2 VIEW & CHEST 1 VIEW)  Comparison: Chest radiographs dated 10/17/2008  Findings: Chronic interstitial markings.  No focal consolidation. Mild scarring in the right mid lung.  No pleural effusion or pneumothorax.  Cardiomegaly.  Nonobstructive bowel gas pattern.  No evidence of free air under the diaphragm on the upright view.  Degenerative changes of the visualized thoracolumbar spine.  IMPRESSION: No evidence of acute cardiopulmonary disease.  No evidence of small bowel obstruction or free air.   Original Report Authenticated By: Charline Bills, M.D.     Medications:  . amLODipine  5 mg Oral Daily  . losartan  50 mg Oral Daily  . predniSONE  20 mg Oral Daily    . sodium chloride 75 mL/hr at 06/22/12 2117  . heparin 1,300 Units/hr (06/22/12 1111)      Assessment/Plan: The patient has pulmonary embolus and right lower lobe she has varicosities in both legs and more prominent in left thigh which could be source of embolus  Patient has hypertension plan to continue IV heparin will complete the venous Doppler ultrasound left leg today to rule out DVT we'll continue heparin drip   LOS: 2 days   Yonas Bunda G 06/23/2012, 6:14 AM

## 2012-06-23 NOTE — Progress Notes (Signed)
Patient's blood pressure was high at 186/77. Doctor was notified and new orders were given to give 10am BP meds early. Will report off to day nurse.

## 2012-06-23 NOTE — Progress Notes (Addendum)
ANTICOAGULATION CONSULT NOTE   Pharmacy Consult for Heparin Indication: pulmonary embolus  Allergies  Allergen Reactions  . Codeine Nausea And Vomiting   Patient Measurements: Height: 5\' 2"  (157.5 cm) Weight: 163 lb (73.936 kg) IBW/kg (Calculated) : 50.1  Vital Signs: Temp: 97.6 F (36.4 C) (03/19 0449) Temp src: Oral (03/19 0449) BP: 186/77 mmHg (03/19 0617) Pulse Rate: 92 (03/19 0449)  Labs:  Recent Labs  06/21/12 1326 06/21/12 1649 06/21/12 2132 06/22/12 0457 06/23/12 0543  HGB 16.7*  --   --  16.7* 16.3*  HCT 47.8*  --   --  48.5* 46.5*  PLT 154  --   --  155 147*  APTT  --  30  --   --   --   LABPROT  --  12.8  --   --   --   INR  --  0.97  --   --   --   HEPARINUNFRC  --   --  0.73* 0.56 0.83*  CREATININE 0.76  --   --   --  0.74  TROPONINI <0.30  --   --   --   --    Estimated Creatinine Clearance: 43.1 ml/min (by C-G formula based on Cr of 0.74).  Medical History: Past Medical History  Diagnosis Date  . Hypertension   . Hypoglycemia   . Chronic headache   . Sinusitis    Medications:  Infusions:  . sodium chloride 75 mL/hr at 06/22/12 2117  . heparin 1,150 Units/hr (06/23/12 0827)   Assessment: 79yo F currently on IV Heparin for PE.  No bleeding noted.  Heparin level above goal range today.  Goal of Therapy:  Heparin level 0.3-0.7 units/ml Monitor platelets by anticoagulation protocol: Yes   Plan: Decrease Heparin at 1150 units/hr Check 8hr heparin level  Heparin level & CBC daily while on heparin Follow-up long-term anticoagulation plans  Elson Clan 06/23/2012,8:34 AM  Update: Heparin changed to Xarelto for PE today.  Renal function & CBC ok.  Dose verified with Dr Juanetta Gosling and updated in computer. She will receive 15mg  bid x3 weeks then 20mg  daily for treatment duration.  Will educate patient on medication.  Junita Push, PharmD, BCPS

## 2012-06-23 NOTE — Progress Notes (Signed)
Patient was complaining of not being able to sleep and asked if she could have sleep medicine. Doctor was notified and new orders were given.

## 2012-06-24 MED ORDER — RIVAROXABAN 15 MG PO TABS: 15.0000 mg | ORAL_TABLET | Freq: Two times a day (BID) | ORAL | Status: DC

## 2012-06-24 NOTE — Discharge Summary (Signed)
[name], [name]               ACCOUNT NO.:  [id number]  MEDICAL RECORD NO.:  [id number]  LOCATION:  A317                          FACILITY:  [facility]  PHYSICIAN:  Imo Cumbie G. Renard Matter, MD   DATE OF BIRTH:  1919-09-11  DATE OF ADMISSION:  06/21/2012 DATE OF DISCHARGE:  03/20/2014LH                              DISCHARGE SUMMARY   DIAGNOSES:  Pulmonary embolus involving right lower lobe of lung, superficial thrombophlebitis of left leg, and hypertension.  CONDITION:  Improved at the time of discharge.  This patient had been complaining of fatigue and weakness.  She does have hypertension, which has been fairly well controlled.  Apparently on the week of admission, she developed nausea and vomiting, but this had resolved.  She had been admitted to [facility] last week for virus infection.  She was evaluated by ED physician.  CBC was essentially normal.  Chemistries were normal, except for a slightly low serum potassium and CT angio of chest was ordered which revealed mild pulmonary embolus on the right lower lobe.  She was given a bolus of heparin in the emergency department and started on intravenous fluids and heparin drip and subsequently was admitted.  PHYSICAL EXAMINATION:  GENERAL:  Alert. VITAL SIGNS:  The patient with blood pressure 148/88, respirations 22, pulse 97, temperature 98.6. NECK:  Supple.  No JVD or thyroid abnormalities. LUNGS:  Clear to P and A. HEART:  Regular rhythm.  No murmurs. ABDOMEN:  No palpable organs or masses. EXTREMITIES:  Free of edema.  The patient does have enlarged superficial veins, medial to left thigh.  The patient was subsequently admitted with a heparin drip in place.  LABORATORY DATA:  CBC on admission showed WBC 5400 with hemoglobin 16.7, hematocrit 48.5.  Subsequent CBC 3/19; WBC 5700 with hemoglobin 16.3, hematocrit 46.5.  Chemistries; sodium 140, potassium 3.7, chloride 102, CO2 28, BUN 9, creatinine is 0.67, and calcium 8.7.   Chest x-ray on admission, no evidence of acute cardiopulmonary disease.  No evidence of small bowel obstruction or free air.  CT angio of the chest reveal mild pulmonary embolism, right lower lobe.  Repeat CT is recommended in 3 months to rule out significant 12 mm right upper lobe density.  Venous Doppler ultrasound on the right leg was essentially normal.  Venous Doppler ultrasound on the left leg showed no evidence of left lower extremity venous thrombosis and superficial venous thrombosis of left upper leg off greater saphenous vein present.  CT of the head without contrast, no acute abnormality, mild progressive chronic small vessel ischemic changes, stable, and mild atrophy.  HOSPITAL COURSE:  The patient at the time of her admission was placed on intravenous fluids 0.9% normal saline.  She was continued on the following medications; amlodipine 5 mg daily, Cozaar 50 mg daily, Deltasone 20 mg daily and was started on Xarelto 15 mg every 12 hours, but 20 mg with the evening dose.  The patient remained at bed rest on heparin drip, relatively asymptomatic with the exception of occasional wheeze and chest congestion.  She did have some slight pain medial to left thigh.  She did have the studies done.  Ultrasound venous Doppler on both  legs.  The venous Doppler ultrasound of left leg showed evidence of superficial thrombophlebitis.  The patient remained stable during the hospital stay.  She had gradually kept up into the chair and moved about and was felt she could be discharged after 3 days hospitalization.     Mar Walmer G. Renard Matter, MD     AGM/MEDQ  D:  06/24/2012  T:  06/24/2012  Job:  [id number]

## 2012-06-24 NOTE — Consult Note (Signed)
[name], [name]               ACCOUNT NO.:  [id number]  MEDICAL RECORD NO.:  [id number]  LOCATION:  A317                          FACILITY:  [facility]  PHYSICIAN:  Adorian Gwynne L. Juanetta Gosling, M.D.DATE OF BIRTH:  17-Jan-1920  DATE OF CONSULTATION: DATE OF DISCHARGE:                                CONSULTATION   Consultation for pulmonary embolus.  HISTORY:  Ms. [name] is a 79 year old, who came to the emergency room with fatigue and weakness.  She underwent CT of the chest and that showed somewhat incidentally pulmonary embolus.  Consultation is requested regarding treatment of that.  She says that she has had episodes of nausea and vomiting, and she was also found to have a 12 mm right upper lobe irregular density on the CT.  PAST MEDICAL HISTORY:  Positive for hypoglycemia, hypertension, and anxiety.  PAST SURGICAL HISTORY:  She has had an appendectomy.  SOCIAL HISTORY:  She is a nonsmoker.  She does not use any alcohol.  REVIEW OF SYSTEMS:  She says that she did not have any chest pain.  She did not have any definite shortness of breath, but it is difficult to be certain about all that history at this point, because she is very anxious.  MEDICATIONS:  As listed in epic.  PHYSICAL EXAMINATION:  VITAL SIGNS:  Temperature is 98.2, pulse 93, respirations 20, blood pressure 158/81, O2 sats 96%. HEENT:  Pupils are reactive.  Nose and throat are clear.  Mucous membranes are moist. NECK:  Supple without masses. CHEST:  Pretty clear now. HEART:  Regular without gallop. ABDOMEN:  Soft.  No masses are felt. EXTREMITIES:  No edema.  She has chronic varicosities.  ASSESSMENT:  I think she does have a pulmonary embolus seen on her CT of the chest.  It is difficult to tell if this is clinically significant, but it could have caused her to have fatigue and weakness.  Considering that I think we need to treat it probably for a period of about 6 months.  She is having bilateral ultrasounds of  her legs.  We discussed possible treatment modalities including Coumadin or other medications and she would like to start Xarelto.  I have ordered Xarelto.  Thanks for allowing me to see her with you.     Cloyde Oregel L. Juanetta Gosling, M.D.     ELH/MEDQ  D:  06/23/2012  T:  06/24/2012  Job:  [id number]

## 2012-06-24 NOTE — Progress Notes (Signed)
Patient discharged home with [facility] nurse in place.   Follow up appointment in place with PCP.  Instructed on new med; xarelto. IV removed.  Patient verbalizes understanding of discharge instructions.  Stable to DC home.  Waiting on son to arrive to take her home.

## 2012-06-24 NOTE — Progress Notes (Addendum)
Patient was extremely anxious. Doctor was notified and new orders were given for xanax 0.25mg  every 6 hours PRN. Also, doctor was made aware of the results from the doppler on her leg. Will continue to monitor patient.

## 2012-06-25 NOTE — Progress Notes (Signed)
UR Chart Review Completed  

## 2012-06-25 NOTE — Discharge Summary (Signed)
[name], [name]               ACCOUNT NO.:  [id number]  MEDICAL RECORD NO.:  [id number]  LOCATION:  A317                          FACILITY:  [facility]  PHYSICIAN:  Taylorann Tkach G. Renard Matter, MD   DATE OF BIRTH:  01-11-1920  DATE OF ADMISSION:  06/21/2012 DATE OF DISCHARGE:  03/20/2014LH                              DISCHARGE SUMMARY   The patient was discharged on the following medications. 1. Amlodipine 5 mg daily. 2. Ibuprofen 200 mg every 6 hours as needed for pain. 3. Losartan 50 mg daily. 4. Prednisone 20 mg daily. 5. Xarelto 15 mg b.i.d.     Damario Gillie G. Renard Matter, MD     AGM/MEDQ  D:  06/24/2012  T:  06/25/2012  Job:  [id number]

## 2013-02-21 ENCOUNTER — Emergency Department (HOSPITAL_COMMUNITY)
Admission: EM | Admit: 2013-02-21 | Discharge: 2013-02-21 | Disposition: A | Discharge status: Home / Self Care | Payer: Medicare Other | Attending: Emergency Medicine | Admitting: Emergency Medicine

## 2013-02-21 ENCOUNTER — Encounter (HOSPITAL_COMMUNITY): Payer: Self-pay | Admitting: Emergency Medicine

## 2013-02-21 DIAGNOSIS — I10 Essential (primary) hypertension: Secondary | ICD-10-CM | POA: Insufficient documentation

## 2013-02-21 DIAGNOSIS — IMO0002 Reserved for concepts with insufficient information to code with codable children: Secondary | ICD-10-CM | POA: Insufficient documentation

## 2013-02-21 DIAGNOSIS — Z8639 Personal history of other endocrine, nutritional and metabolic disease: Secondary | ICD-10-CM | POA: Insufficient documentation

## 2013-02-21 DIAGNOSIS — M791 Myalgia, unspecified site: Secondary | ICD-10-CM

## 2013-02-21 DIAGNOSIS — Z862 Personal history of diseases of the blood and blood-forming organs and certain disorders involving the immune mechanism: Secondary | ICD-10-CM | POA: Insufficient documentation

## 2013-02-21 DIAGNOSIS — Z8709 Personal history of other diseases of the respiratory system: Secondary | ICD-10-CM | POA: Insufficient documentation

## 2013-02-21 DIAGNOSIS — Z79899 Other long term (current) drug therapy: Secondary | ICD-10-CM | POA: Insufficient documentation

## 2013-02-21 DIAGNOSIS — IMO0001 Reserved for inherently not codable concepts without codable children: Secondary | ICD-10-CM | POA: Insufficient documentation

## 2013-02-21 MED ORDER — METHOCARBAMOL 500 MG PO TABS: 500.0000 mg | ORAL_TABLET | Freq: Three times a day (TID) | ORAL | Status: DC | PRN

## 2013-02-21 NOTE — ED Provider Notes (Addendum)
CSN: [id number]     Arrival date & time 02/21/13  1420 History   First MD Initiated Contact with Patient 02/21/13 1458     Chief Complaint  Patient presents with  . Shoulder Pain    HPI  Patient presents with family. She waking this morning felt like her neck was sore. She is in pain adjacent her shoulders and neck posteriorly. She states to extend or flex her neck rotator neck feel stiff she denies fever. She's not been confused. No shortness of breath. No pleuritic discomfort. No anterior chest pain.  Past Medical History  Diagnosis Date  . Hypertension   . Hypoglycemia   . Chronic headache   . Sinusitis    Past Surgical History  Procedure Laterality Date  . Appendectomy     History reviewed. No pertinent family history. History  Substance Use Topics  . Smoking status: Never Smoker   . Smokeless tobacco: Not on file  . Alcohol Use: No   OB History   Grav Para Term Preterm Abortions TAB SAB Ect Mult Living                 Review of Systems  Constitutional: Negative for fever, chills, diaphoresis, appetite change and fatigue.  HENT: Negative for mouth sores, sore throat and trouble swallowing.   Eyes: Negative for visual disturbance.  Respiratory: Negative for cough, chest tightness, shortness of breath and wheezing.   Cardiovascular: Negative for chest pain.  Gastrointestinal: Negative for nausea, vomiting, abdominal pain, diarrhea and abdominal distention.  Endocrine: Negative for polydipsia, polyphagia and polyuria.  Genitourinary: Negative for dysuria, frequency and hematuria.  Musculoskeletal: Negative for gait problem.       Has pain adjacent to her shoulders and neck posteriorly  Skin: Negative for color change, pallor and rash.  Neurological: Negative for dizziness, syncope, light-headedness and headaches.  Hematological: Does not bruise/bleed easily.  Psychiatric/Behavioral: Negative for behavioral problems and confusion.    Allergies  Codeine  Home  Medications   Current Outpatient Rx  Name  Route  Sig  Dispense  Refill  . amLODipine (NORVASC) 5 MG tablet   Oral   Take 5 mg by mouth daily.         Marland Kitchen ibuprofen (ADVIL,MOTRIN) 200 MG tablet   Oral   Take 200 mg by mouth every 6 (six) hours as needed for pain.         Marland Kitchen losartan (COZAAR) 50 MG tablet   Oral   Take 50 mg by mouth daily.         . methocarbamol (ROBAXIN) 500 MG tablet   Oral   Take 1 tablet (500 mg total) by mouth 3 (three) times daily between meals as needed.   20 tablet   0   . predniSONE (DELTASONE) 20 MG tablet   Oral   Take 20 mg by mouth daily. Take 60 mg by mouth on day one, then 40 mg daily for 4 days.         . rivaroxaban (XARELTO) 15 MG TABS tablet   Oral   Take 1 tablet (15 mg total) by mouth every 12 (twelve) hours.   20 tablet   1    BP 165/124  Temp(Src) 98.2 F (36.8 C) (Oral)  Resp 19  Ht 5\' 2"  (1.575 m)  Wt 160 lb (72.576 kg)  BMI 29.26 kg/m2  SpO2 93% Physical Exam  Constitutional: She is oriented to person, place, and time. She appears well-developed and well-nourished. No distress.  She is awake alert.  HENT:  Head: Normocephalic.  Eyes: Conjunctivae are normal. Pupils are equal, round, and reactive to light. No scleral icterus.  Neck: Normal range of motion. Neck supple. No thyromegaly present.  Cardiovascular: Normal rate, regular rhythm, S1 normal and S2 normal.  Exam reveals no gallop and no friction rub.   No murmur heard. Pulmonary/Chest: Effort normal and breath sounds normal. No respiratory distress. She has no wheezes. She has no rales.  Clear lungs  Abdominal: Soft. Bowel sounds are normal. She exhibits no distension. There is no tenderness. There is no rebound.  Musculoskeletal: Normal range of motion.  Tender to palpation the paraspinal musculature and trapezius. She does not have meningismus.  Neurological: She is alert and oriented to person, place, and time.  No weakness or radicular pain  Skin: Skin  is warm and dry. No rash noted.  Psychiatric: She has a normal mood and affect. Her behavior is normal.    ED Course  Procedures (including critical care time) Labs Review Labs Reviewed - No data to display Imaging Review No results found.  EKG Interpretation   None       MDM   1. Muscular pain    Pain is immediately reproducible symmetric in bilateral para spinal and trapezius. No pain with breathing. No dyspnea. Right ear pain in the chest. This is very clearly muscular. She is not on statin. Not hypoxemic or tachycardic. No diagnostic studies indicated. Considering her age, allergies, and medications Will use Robaxin have encouraged her to followup with her primary care physician.    Roney Marion, MD 02/21/13 1515  Roney Marion, MD 02/21/13 [phone]

## 2013-02-21 NOTE — ED Notes (Signed)
Complain of pain in neck and shoulder that started today

## 2013-08-03 ENCOUNTER — Emergency Department (HOSPITAL_COMMUNITY)
Admission: EM | Admit: 2013-08-03 | Discharge: 2013-08-03 | Disposition: A | Discharge status: Home / Self Care | Payer: Medicare PPO | Attending: Emergency Medicine | Admitting: Emergency Medicine

## 2013-08-03 ENCOUNTER — Encounter (HOSPITAL_COMMUNITY): Payer: Self-pay | Admitting: Emergency Medicine

## 2013-08-03 ENCOUNTER — Emergency Department (HOSPITAL_COMMUNITY): Payer: Medicare PPO

## 2013-08-03 DIAGNOSIS — IMO0002 Reserved for concepts with insufficient information to code with codable children: Secondary | ICD-10-CM | POA: Insufficient documentation

## 2013-08-03 DIAGNOSIS — Z862 Personal history of diseases of the blood and blood-forming organs and certain disorders involving the immune mechanism: Secondary | ICD-10-CM | POA: Insufficient documentation

## 2013-08-03 DIAGNOSIS — G8929 Other chronic pain: Secondary | ICD-10-CM | POA: Insufficient documentation

## 2013-08-03 DIAGNOSIS — M25519 Pain in unspecified shoulder: Secondary | ICD-10-CM | POA: Insufficient documentation

## 2013-08-03 DIAGNOSIS — Z8709 Personal history of other diseases of the respiratory system: Secondary | ICD-10-CM | POA: Insufficient documentation

## 2013-08-03 DIAGNOSIS — Z7901 Long term (current) use of anticoagulants: Secondary | ICD-10-CM | POA: Insufficient documentation

## 2013-08-03 DIAGNOSIS — M25512 Pain in left shoulder: Secondary | ICD-10-CM

## 2013-08-03 DIAGNOSIS — I10 Essential (primary) hypertension: Secondary | ICD-10-CM | POA: Insufficient documentation

## 2013-08-03 DIAGNOSIS — Z79899 Other long term (current) drug therapy: Secondary | ICD-10-CM | POA: Insufficient documentation

## 2013-08-03 DIAGNOSIS — Z8639 Personal history of other endocrine, nutritional and metabolic disease: Secondary | ICD-10-CM | POA: Insufficient documentation

## 2013-08-03 MED ORDER — NAPROXEN 250 MG PO TABS
500.0000 mg | ORAL_TABLET | Freq: Once | ORAL | Status: AC
  Administered 2013-08-03: 500 mg via ORAL
  Filled 2013-08-03: qty 2

## 2013-08-03 MED ORDER — TRAMADOL HCL 50 MG PO TABS: 50.0000 mg | ORAL_TABLET | Freq: Four times a day (QID) | ORAL | Status: DC | PRN

## 2013-08-03 MED ORDER — NAPROXEN 500 MG PO TABS: 500.0000 mg | ORAL_TABLET | Freq: Two times a day (BID) | ORAL | Status: DC

## 2013-08-03 NOTE — ED Provider Notes (Signed)
CSN: [id number]     Arrival date & time 08/03/13  0159 History   First MD Initiated Contact with Patient 08/03/13 0203     No chief complaint on file.    (Consider location/radiation/quality/duration/timing/severity/associated sxs/prior Treatment) HPI Comments: 80 year old female with a history of left shoulder injury last year when she fell to the ground who has daily mild pain. She awoke this evening with acute worsening of the left shoulder pain, worse with movement of the shoulder, denies recent injury. Symptoms are persistent, moderate, worse with range of motion of the left arm. She denies chest pain cough or shortness of breath and has no other numbness weakness or difficulty using her other 3 extremities. No pain medications given prior to arrival.  The history is provided by the patient.    Past Medical History  Diagnosis Date  . Hypertension   . Hypoglycemia   . Chronic headache   . Sinusitis    Past Surgical History  Procedure Laterality Date  . Appendectomy     No family history on file. History  Substance Use Topics  . Smoking status: Never Smoker   . Smokeless tobacco: Not on file  . Alcohol Use: No   OB History   Grav Para Term Preterm Abortions TAB SAB Ect Mult Living                 Review of Systems  All other systems reviewed and are negative.     Allergies  Codeine  Home Medications   Prior to Admission medications   Medication Sig Start Date End Date Taking? Authorizing Provider  amLODipine (NORVASC) 5 MG tablet Take 5 mg by mouth daily.   Yes Historical Provider, MD  ibuprofen (ADVIL,MOTRIN) 200 MG tablet Take 200 mg by mouth every 6 (six) hours as needed for pain.   Yes Historical Provider, MD  losartan (COZAAR) 50 MG tablet Take 50 mg by mouth daily.   Yes Historical Provider, MD  rivaroxaban (XARELTO) 15 MG TABS tablet Take 1 tablet (15 mg total) by mouth every 12 (twelve) hours. 06/24/12  Yes Angus Edilia BoG McInnis, MD  methocarbamol (ROBAXIN)  500 MG tablet Take 1 tablet (500 mg total) by mouth 3 (three) times daily between meals as needed. 02/21/13   Rolland PorterMark James, MD  predniSONE (DELTASONE) 20 MG tablet Take 20 mg by mouth daily. Take 60 mg by mouth on day one, then 40 mg daily for 4 days.    Historical Provider, MD   BP 161/99  Pulse 88  Temp(Src) 98.2 F (36.8 C) (Oral)  Resp 20  Ht 5\' 3"  (1.6 m)  Wt 160 lb (72.576 kg)  BMI 28.35 kg/m2  SpO2 95% Physical Exam  Nursing note and vitals reviewed. Constitutional: She appears well-developed and well-nourished. No distress.  HENT:  Head: Normocephalic and atraumatic.  Mouth/Throat: Oropharynx is clear and moist. No oropharyngeal exudate.  Eyes: Conjunctivae and EOM are normal. Pupils are equal, round, and reactive to light. Right eye exhibits no discharge. Left eye exhibits no discharge. No scleral icterus.  Neck: Normal range of motion. Neck supple. No JVD present. No thyromegaly present.  Cardiovascular: Normal rate, regular rhythm, normal heart sounds and intact distal pulses.  Exam reveals no gallop and no friction rub.   No murmur heard. Pulmonary/Chest: Effort normal and breath sounds normal. No respiratory distress. She has no wheezes. She has no rales.  Abdominal: Soft. Bowel sounds are normal. She exhibits no distension and no mass. There is no tenderness.  Musculoskeletal: She exhibits tenderness. She exhibits no edema.  Pain with range of motion of the left shoulder and both rotation extension and flexion. Normal contour of the left shoulder, tenderness with palpation around the left shoulder girdle, normal range of motion of the bilateral lower extremities, right upper extremity and the left upper extremity at the elbow and wrist  Lymphadenopathy:    She has no cervical adenopathy.  Neurological: She is alert. Coordination normal.  Normal strength and sensation of all 4 extremities  Skin: Skin is warm and dry. No rash noted. No erythema.  Psychiatric: She has a normal  mood and affect. Her behavior is normal.    ED Course  Procedures (including critical care time) Labs Review Labs Reviewed - No data to display  Imaging Review Dg Shoulder Left  08/03/2013   CLINICAL DATA:  Status post fall; acute left shoulder pain.  EXAM: LEFT SHOULDER - 2+ VIEW  COMPARISON:  None.  FINDINGS: There is no evidence of fracture or dislocation. The left humeral head is seated within the glenoid fossa. The acromioclavicular joint is unremarkable in appearance. No significant soft tissue abnormalities are seen. The visualized portions of the left lung are clear.  IMPRESSION: No evidence of fracture or dislocation.   Electronically Signed   By: Roanna RaiderJeffery  Chang M.D.   On: 08/03/2013 03:15      MDM   Final diagnoses:  Shoulder pain, left    Overall well-appearing but has focal tenderness with range of motion and palpation over the left shoulder. The joint is not red, is not warm, is not swollen. Will obtain x-ray, the patient does not have fever, doubt septic joint, possible acute gouty arthritis, possible arthritis, less likely fracture.  X-rays negative, no fever, no tachycardia, pain medications given with some improvement, stable for discharge. Return precautions given for signs of gout or septic joint, understanding expressed.  Meds given in ED:  Medications  naproxen (NAPROSYN) tablet 500 mg (500 mg Oral Given 08/03/13 0253)    New Prescriptions   NAPROXEN (NAPROSYN) 500 MG TABLET    Take 1 tablet (500 mg total) by mouth 2 (two) times daily with a meal.   TRAMADOL (ULTRAM) 50 MG TABLET    Take 1 tablet (50 mg total) by mouth every 6 (six) hours as needed.      Vida RollerBrian D Cheyrl Buley, MD 08/03/13 [id number]

## 2013-08-03 NOTE — ED Notes (Signed)
Left shoulder pain for quite some time after a fall, states pain improved, but got worse overnight.

## 2013-10-26 ENCOUNTER — Emergency Department (HOSPITAL_COMMUNITY): Payer: Medicare PPO

## 2013-10-26 ENCOUNTER — Emergency Department (HOSPITAL_COMMUNITY)
Admission: EM | Admit: 2013-10-26 | Discharge: 2013-10-26 | Disposition: A | Discharge status: Home / Self Care | Payer: Medicare PPO | Attending: Emergency Medicine | Admitting: Emergency Medicine

## 2013-10-26 ENCOUNTER — Encounter (HOSPITAL_COMMUNITY): Payer: Self-pay | Admitting: Emergency Medicine

## 2013-10-26 DIAGNOSIS — I10 Essential (primary) hypertension: Secondary | ICD-10-CM | POA: Insufficient documentation

## 2013-10-26 DIAGNOSIS — Z7901 Long term (current) use of anticoagulants: Secondary | ICD-10-CM | POA: Diagnosis not present

## 2013-10-26 DIAGNOSIS — R5381 Other malaise: Secondary | ICD-10-CM | POA: Diagnosis present

## 2013-10-26 DIAGNOSIS — Z8709 Personal history of other diseases of the respiratory system: Secondary | ICD-10-CM | POA: Diagnosis not present

## 2013-10-26 DIAGNOSIS — R5383 Other fatigue: Secondary | ICD-10-CM

## 2013-10-26 DIAGNOSIS — Z862 Personal history of diseases of the blood and blood-forming organs and certain disorders involving the immune mechanism: Secondary | ICD-10-CM | POA: Insufficient documentation

## 2013-10-26 DIAGNOSIS — G8929 Other chronic pain: Secondary | ICD-10-CM | POA: Diagnosis not present

## 2013-10-26 DIAGNOSIS — Z79899 Other long term (current) drug therapy: Secondary | ICD-10-CM | POA: Diagnosis not present

## 2013-10-26 DIAGNOSIS — Z8639 Personal history of other endocrine, nutritional and metabolic disease: Secondary | ICD-10-CM | POA: Insufficient documentation

## 2013-10-26 DIAGNOSIS — Z791 Long term (current) use of non-steroidal anti-inflammatories (NSAID): Secondary | ICD-10-CM | POA: Insufficient documentation

## 2013-10-26 LAB — PROTIME-INR
INR: 1.14 (ref 0.00–1.49)
Prothrombin Time: 14.6 seconds (ref 11.6–15.2)

## 2013-10-26 LAB — URINALYSIS, ROUTINE W REFLEX MICROSCOPIC
Bilirubin Urine: NEGATIVE
Glucose, UA: NEGATIVE mg/dL
Hgb urine dipstick: NEGATIVE
KETONES UR: NEGATIVE mg/dL
Leukocytes, UA: NEGATIVE
NITRITE: NEGATIVE
PROTEIN: NEGATIVE mg/dL
Specific Gravity, Urine: 1.01 (ref 1.005–1.030)
Urobilinogen, UA: 0.2 mg/dL (ref 0.0–1.0)
pH: 6.5 (ref 5.0–8.0)

## 2013-10-26 LAB — BASIC METABOLIC PANEL
Anion gap: 9 (ref 5–15)
BUN: 16 mg/dL (ref 6–23)
CALCIUM: 8.8 mg/dL (ref 8.4–10.5)
CO2: 29 mEq/L (ref 19–32)
Chloride: 105 mEq/L (ref 96–112)
Creatinine, Ser: 0.7 mg/dL (ref 0.50–1.10)
GFR calc non Af Amer: 73 mL/min — ABNORMAL LOW (ref >90)
GFR, EST AFRICAN AMERICAN: 85 mL/min — AB (ref >90)
Glucose, Bld: 98 mg/dL (ref 70–99)
POTASSIUM: 4.1 meq/L (ref 3.7–5.3)
Sodium: 143 mEq/L (ref 137–147)

## 2013-10-26 LAB — CBC WITH DIFFERENTIAL/PLATELET
BASOS ABS: 0 10*3/uL (ref 0.0–0.1)
BASOS PCT: 0 % (ref 0–1)
EOS ABS: 0.1 10*3/uL (ref 0.0–0.7)
EOS PCT: 1 % (ref 0–5)
HCT: 44.7 % (ref 36.0–46.0)
Hemoglobin: 14.9 g/dL (ref 12.0–15.0)
LYMPHS ABS: 2 10*3/uL (ref 0.7–4.0)
Lymphocytes Relative: 35 % (ref 12–46)
MCH: 31.2 pg (ref 26.0–34.0)
MCHC: 33.3 g/dL (ref 30.0–36.0)
MCV: 93.7 fL (ref 78.0–100.0)
Monocytes Absolute: 0.5 10*3/uL (ref 0.1–1.0)
Monocytes Relative: 8 % (ref 3–12)
Neutro Abs: 3 10*3/uL (ref 1.7–7.7)
Neutrophils Relative %: 56 % (ref 43–77)
PLATELETS: 150 10*3/uL (ref 150–400)
RBC: 4.77 MIL/uL (ref 3.87–5.11)
RDW: 15.7 % — ABNORMAL HIGH (ref 11.5–15.5)
WBC: 5.5 10*3/uL (ref 4.0–10.5)

## 2013-10-26 LAB — CBG MONITORING, ED: Glucose-Capillary: 99 mg/dL (ref 70–99)

## 2013-10-26 MED ORDER — SODIUM CHLORIDE 0.9 % IV BOLUS (SEPSIS)
250.0000 mL | Freq: Once | INTRAVENOUS | Status: AC
  Administered 2013-10-26: 250 mL via INTRAVENOUS

## 2013-10-26 MED ORDER — SODIUM CHLORIDE 0.9 % IV SOLN: INTRAVENOUS | Status: DC

## 2013-10-26 NOTE — ED Notes (Signed)
Patient walked to restroom and back to bed without assistance.

## 2013-10-26 NOTE — ED Notes (Signed)
Pt ambulated to bathroom without difficulty.

## 2013-10-26 NOTE — ED Provider Notes (Signed)
CSN: [id number]     Arrival date & time 10/26/13  1241 History  This chart was scribed for Flint MelterElliott L Venida Tsukamoto, MD by Ardelia Memsylan Malpass, ED Scribe. This patient was seen in room [facility] and the patient's care was started at 2:00 PM.   Chief Complaint  Patient presents with  . Fatigue    The history is provided by the patient. No language interpreter was used.    HPI Comments: [name] is a 80 y.o. female with prior history of PE who presents to the Emergency Department complaining of generalized weakness onset gradually this morning. Pt states she has prior history of similar episodes of weakness. She has had prior episodes of hypoglycemia, but states that she has been eating normally today  Pt suspects that she may have a blood clot, because she felt fatigued with the prior PE. Pt is complaint with her prescribed Xarelto. She is ambulating today, as usual. She does not use a assistive device for walking. Patient denies dyspnea, dizziness, shortness of breath. Patient denies insomnia.  Patient is ambulatory.    Past Medical History  Diagnosis Date  . Hypertension   . Hypoglycemia   . Chronic headache   . Sinusitis    Past Surgical History  Procedure Laterality Date  . Appendectomy     No family history on file. History  Substance Use Topics  . Smoking status: Never Smoker   . Smokeless tobacco: Not on file  . Alcohol Use: No   OB History   Grav Para Term Preterm Abortions TAB SAB Ect Mult Living                 Review of Systems  Constitutional: Positive for fatigue. Negative for fever.  Respiratory: Negative for cough.   Genitourinary: Negative for dysuria.  Neurological: Positive for weakness.  All other systems reviewed and are negative.   Allergies  Codeine  Home Medications   Prior to Admission medications   Medication Sig Start Date End Date Taking? Authorizing Provider  amLODipine (NORVASC) 5 MG tablet Take 5 mg by mouth daily.   Yes Historical Provider,  MD  ibuprofen (ADVIL,MOTRIN) 200 MG tablet Take 200 mg by mouth every 6 (six) hours as needed for pain.   Yes Historical Provider, MD  losartan (COZAAR) 50 MG tablet Take 50 mg by mouth daily.   Yes Historical Provider, MD  rivaroxaban (XARELTO) 20 MG TABS tablet Take 20 mg by mouth at bedtime.   Yes Historical Provider, MD   Triage Vitals: BP 178/81  Pulse 85  Temp(Src) 98.6 F (37 C) (Oral)  Resp 16  Ht 5\' 2"  (1.575 m)  Wt 160 lb (72.576 kg)  BMI 29.26 kg/m2  SpO2 96%  Physical Exam  Nursing note and vitals reviewed. Constitutional: She is oriented to person, place, and time. She appears well-developed and well-nourished.  HENT:  Head: Normocephalic and atraumatic.  Eyes: Conjunctivae and EOM are normal. Pupils are equal, round, and reactive to light.  Neck: Normal range of motion and phonation normal. Neck supple.  Cardiovascular: Normal rate, regular rhythm and intact distal pulses.   Pulmonary/Chest: Effort normal and breath sounds normal. She has no wheezes. She has no rales. She exhibits no tenderness.  Rhonchi on left side  Abdominal: Soft. She exhibits no distension. There is no tenderness. There is no guarding.  Musculoskeletal: Normal range of motion. She exhibits edema (Symmetric 2+ edema).  Neurological: She is alert and oriented to person, place, and time. She exhibits  normal muscle tone.  Skin: Skin is warm and dry.  Psychiatric: She has a normal mood and affect. Her behavior is normal. Judgment and thought content normal.    ED Course  Procedures (including critical care time)  DIAGNOSTIC STUDIES: Oxygen Saturation is 96% on RA, normal by my interpretation.    COORDINATION OF CARE: 2:05 PM- Will order a CXR and diagnostic lab work. Will also order IV fluids. Pt advised of plan for treatment and pt agrees.  3:06 PM- Recheck and discussed normal lab and radiology findings. Pt states that she is feeling slightly improved after receiving IV fluids. She has been  ambulating well while in the ED today.   Patient Vitals for the past 24 hrs:  BP Temp Temp src Pulse Resp SpO2 Height Weight  10/26/13 1311 178/81 mmHg 98.6 F (37 C) Oral 85 16 96 % 5\' 2"  (1.575 m) 160 lb (72.576 kg)    Medications  0.9 %  sodium chloride infusion (not administered)  sodium chloride 0.9 % bolus 250 mL (250 mLs Intravenous Rate/Dose Change 10/26/13 1441)    Labs Review Labs Reviewed  BASIC METABOLIC PANEL - Abnormal; Notable for the following:    GFR calc non Af Amer 73 (*)    GFR calc Af Amer 85 (*)    All other components within normal limits  CBC WITH DIFFERENTIAL - Abnormal; Notable for the following:    RDW 15.7 (*)    All other components within normal limits  URINALYSIS, ROUTINE W REFLEX MICROSCOPIC  PROTIME-INR  CBG MONITORING, ED    Imaging Review Dg Chest 2 View  10/26/2013   CLINICAL DATA:  Fatigue.  Weakness.  Hypertension.  EXAM: CHEST  2 VIEW  COMPARISON:  06/21/2012  FINDINGS: Cardiac silhouette is mildly enlarged. Aorta is uncoiled and tortuous. No mediastinal or hilar masses or convincing adenopathy.  Lungs are clear.  No pleural effusion.  No pneumothorax.  Bony thorax is diffusely demineralized but intact.  IMPRESSION: No acute cardiopulmonary disease.   Electronically Signed   By: Amie Portland M.D.   On: 10/26/2013 13:57     EKG Interpretation   Date/Time:  Wednesday October 26 2013 13:29:32 EDT Ventricular Rate:  75 PR Interval:  170 QRS Duration: 96 QT Interval:  389 QTC Calculation: 434 R Axis:   -48 Text Interpretation:  Sinus arrhythmia Left anterior fascicular block Low  voltage, precordial leads Anteroseptal infarct, old Abnormal T, consider  ischemia, lateral leads since last tracing no significant change Confirmed  by Effie Shy  MD, Leonard Feigel ([id number]) on 10/26/2013 1:34:46 PM      MDM   Final diagnoses:  None    Fatigued with normal ED evaluation. Doubt serious bacterial infection, metabolic instability, or impending vascular  collapse.  Nursing Notes Reviewed/ Care Coordinated Applicable Imaging Reviewed Interpretation of Laboratory Data incorporated into ED treatment  The patient appears reasonably screened and/or stabilized for discharge and I doubt any other medical condition or other [facility] requiring further screening, evaluation, or treatment in the ED at this time prior to discharge.  Plan: Home Medications- usual; Home Treatments- rest; return here if the recommended treatment, does not improve the symptoms; Recommended follow up- PCP check up in 1 week, and prn   I personally performed the services described in this documentation, which was scribed in my presence. The recorded information has been reviewed and is accurate.    Flint Melter, MD 10/26/13 [id number]

## 2013-10-26 NOTE — Discharge Instructions (Signed)
Get plenty of rest, eat 3 meals a day, and drink plenty of fluids.  Call your doctor to arrange a followup visit in one or 2 days for a checkup.

## 2013-10-26 NOTE — ED Notes (Signed)
Pt states she is feeling very weak and tired since waking this morning. Family member states that last time she felt this way, she had a blood clot. Pt is on xeralto. Denies SOB, N/V/D, states only "very tired"

## 2013-10-26 NOTE — ED Notes (Signed)
EDP stated could obtain clean cath urine.

## 2013-11-25 ENCOUNTER — Emergency Department (HOSPITAL_COMMUNITY)
Admission: EM | Admit: 2013-11-25 | Discharge: 2013-11-25 | Disposition: A | Discharge status: Home / Self Care | Payer: Medicare PPO | Attending: Emergency Medicine | Admitting: Emergency Medicine

## 2013-11-25 ENCOUNTER — Encounter (HOSPITAL_COMMUNITY): Payer: Self-pay | Admitting: Emergency Medicine

## 2013-11-25 ENCOUNTER — Emergency Department (HOSPITAL_COMMUNITY): Payer: Medicare PPO

## 2013-11-25 DIAGNOSIS — R531 Weakness: Secondary | ICD-10-CM

## 2013-11-25 DIAGNOSIS — E162 Hypoglycemia, unspecified: Secondary | ICD-10-CM | POA: Diagnosis not present

## 2013-11-25 DIAGNOSIS — I10 Essential (primary) hypertension: Secondary | ICD-10-CM | POA: Diagnosis not present

## 2013-11-25 DIAGNOSIS — Z79899 Other long term (current) drug therapy: Secondary | ICD-10-CM | POA: Diagnosis not present

## 2013-11-25 DIAGNOSIS — Z8709 Personal history of other diseases of the respiratory system: Secondary | ICD-10-CM | POA: Diagnosis not present

## 2013-11-25 DIAGNOSIS — R51 Headache: Secondary | ICD-10-CM | POA: Diagnosis not present

## 2013-11-25 DIAGNOSIS — Z7901 Long term (current) use of anticoagulants: Secondary | ICD-10-CM | POA: Diagnosis not present

## 2013-11-25 DIAGNOSIS — R630 Anorexia: Secondary | ICD-10-CM | POA: Diagnosis not present

## 2013-11-25 DIAGNOSIS — R5381 Other malaise: Secondary | ICD-10-CM | POA: Diagnosis present

## 2013-11-25 DIAGNOSIS — G8929 Other chronic pain: Secondary | ICD-10-CM | POA: Insufficient documentation

## 2013-11-25 DIAGNOSIS — R5383 Other fatigue: Secondary | ICD-10-CM

## 2013-11-25 LAB — CBC WITH DIFFERENTIAL/PLATELET
BASOS ABS: 0 10*3/uL (ref 0.0–0.1)
Basophils Relative: 0 % (ref 0–1)
EOS ABS: 0.1 10*3/uL (ref 0.0–0.7)
Eosinophils Relative: 2 % (ref 0–5)
HEMATOCRIT: 42.8 % (ref 36.0–46.0)
Hemoglobin: 14.7 g/dL (ref 12.0–15.0)
LYMPHS ABS: 2.3 10*3/uL (ref 0.7–4.0)
LYMPHS PCT: 39 % (ref 12–46)
MCH: 31.6 pg (ref 26.0–34.0)
MCHC: 34.3 g/dL (ref 30.0–36.0)
MCV: 92 fL (ref 78.0–100.0)
MONO ABS: 0.5 10*3/uL (ref 0.1–1.0)
Monocytes Relative: 8 % (ref 3–12)
Neutro Abs: 3 10*3/uL (ref 1.7–7.7)
Neutrophils Relative %: 51 % (ref 43–77)
Platelets: 160 10*3/uL (ref 150–400)
RBC: 4.65 MIL/uL (ref 3.87–5.11)
RDW: 15.6 % — AB (ref 11.5–15.5)
WBC: 5.9 10*3/uL (ref 4.0–10.5)

## 2013-11-25 LAB — TROPONIN I: Troponin I: <0.3 ng/mL (ref <0.30)

## 2013-11-25 LAB — BASIC METABOLIC PANEL
Anion gap: 13 (ref 5–15)
BUN: 18 mg/dL (ref 6–23)
CALCIUM: 9 mg/dL (ref 8.4–10.5)
CO2: 25 mEq/L (ref 19–32)
CREATININE: 0.72 mg/dL (ref 0.50–1.10)
Chloride: 104 mEq/L (ref 96–112)
GFR calc Af Amer: 84 mL/min — ABNORMAL LOW (ref >90)
GFR, EST NON AFRICAN AMERICAN: 72 mL/min — AB (ref >90)
GLUCOSE: 140 mg/dL — AB (ref 70–99)
Potassium: 4.1 mEq/L (ref 3.7–5.3)
SODIUM: 142 meq/L (ref 137–147)

## 2013-11-25 LAB — CBG MONITORING, ED: Glucose-Capillary: 150 mg/dL — ABNORMAL HIGH (ref 70–99)

## 2013-11-25 NOTE — ED Notes (Addendum)
Pt states she has been feeling weak, shaky, and  like her blood sugar is low. Pt also c/o a dull headache on the top of the left side of her head x 1 week.

## 2013-11-25 NOTE — Discharge Instructions (Signed)
Weakness Weakness is a lack of strength. It may be felt all over the body (generalized) or in one specific part of the body (focal). Some causes of weakness can be serious. You may need further medical evaluation, especially if you are elderly or you have a history of immunosuppression (such as chemotherapy or HIV), kidney disease, heart disease, or diabetes. CAUSES  Weakness can be caused by many different things, including:  Infection.  Physical exhaustion.  Internal bleeding or other blood loss that results in a lack of red blood cells (anemia).  Dehydration. This cause is more common in elderly people.  Side effects or electrolyte abnormalities from medicines, such as pain medicines or sedatives.  Emotional distress, anxiety, or depression.  Circulation problems, especially severe peripheral arterial disease.  Heart disease, such as rapid atrial fibrillation, bradycardia, or heart failure.  Nervous system disorders, such as Guillain-Barr syndrome, multiple sclerosis, or stroke. DIAGNOSIS  To find the cause of your weakness, your caregiver will take your history and perform a physical exam. Lab tests or X-rays may also be ordered, if needed. TREATMENT  Treatment of weakness depends on the cause of your symptoms and can vary greatly. HOME CARE INSTRUCTIONS   Rest as needed.  Eat a well-balanced diet.  Try to get some exercise every day.  Only take over-the-counter or prescription medicines as directed by your caregiver. SEEK MEDICAL CARE IF:   Your weakness seems to be getting worse or spreads to other parts of your body.  You develop new aches or pains. SEEK IMMEDIATE MEDICAL CARE IF:   You cannot perform your normal daily activities, such as getting dressed and feeding yourself.  You cannot walk up and down stairs, or you feel exhausted when you do so.  You have shortness of breath or chest pain.  You have difficulty moving parts of your body.  You have weakness  in only one area of the body or on only one side of the body.  You have a fever.  You have trouble speaking or swallowing.  You cannot control your bladder or bowel movements.  You have black or bloody vomit or stools. MAKE SURE YOU:  Understand these instructions.  Will watch your condition.  Will get help right away if you are not doing well or get worse. Document Released: 03/24/2005 Document Revised: 09/23/2011 Document Reviewed: 05/23/2011 [facility] Patient Information 2015 [place], [place]. This information is not intended to replace advice given to you by your health care provider. Make sure you discuss any questions you have with your health care provider.  Hypoglycemia Hypoglycemia occurs when the glucose in your blood is too low. Glucose is a type of sugar that is your body's main energy source. Hormones, such as insulin and glucagon, control the level of glucose in the blood. Insulin lowers blood glucose and glucagon increases blood glucose. Having too much insulin in your blood stream, or not eating enough food containing sugar, can result in hypoglycemia. Hypoglycemia can happen to people with or without diabetes. It can develop quickly and can be a medical emergency.  CAUSES   Missing or delaying meals.  Not eating enough carbohydrates at meals.  Taking too much diabetes medicine.  Not timing your oral diabetes medicine or insulin doses with meals, snacks, and exercise.  Nausea and vomiting.  Certain medicines.  Severe illnesses, such as hepatitis, kidney disorders, and certain eating disorders.  Increased activity or exercise without eating something extra or adjusting medicines.  Drinking too much alcohol.  A  nerve disorder that affects body functions like your heart rate, blood pressure, and digestion (autonomic neuropathy).  A condition where the stomach muscles do not function properly (gastroparesis). Therefore, medicines and food may not absorb  properly.  Rarely, a tumor of the pancreas can produce too much insulin. SYMPTOMS   Hunger.  Sweating (diaphoresis).  Change in body temperature.  Shakiness.  Headache.  Anxiety.  Lightheadedness.  Irritability.  Difficulty concentrating.  Dry mouth.  Tingling or numbness in the hands or feet.  Restless sleep or sleep disturbances.  Altered speech and coordination.  Change in mental status.  Seizures or prolonged convulsions.  Combativeness.  Drowsiness (lethargic).  Weakness.  Increased heart rate or palpitations.  Confusion.  Pale, gray skin color.  Blurred or double vision.  Fainting. DIAGNOSIS  A physical exam and medical history will be performed. Your caregiver may make a diagnosis based on your symptoms. Blood tests and other lab tests may be performed to confirm a diagnosis. Once the diagnosis is made, your caregiver will see if your signs and symptoms go away once your blood glucose is raised.  TREATMENT  Usually, you can easily treat your hypoglycemia when you notice symptoms.  Check your blood glucose. If it is less than 70 mg/dl, take one of the following:   3-4 glucose tablets.    cup juice.    cup regular soda.   1 cup skim milk.   -1 tube of glucose gel.   5-6 hard candies.   Avoid high-fat drinks or food that may delay a rise in blood glucose levels.  Do not take more than the recommended amount of sugary foods, drinks, gel, or tablets. Doing so will cause your blood glucose to go too high.   Wait 10-15 minutes and recheck your blood glucose. If it is still less than 70 mg/dl or below your target range, repeat treatment.   Eat a snack if it is more than 1 hour until your next meal.  There may be a time when your blood glucose may go so low that you are unable to treat yourself at home when you start to notice symptoms. You may need someone to help you. You may even faint or be unable to swallow. If you cannot  treat yourself, someone will need to bring you to the hospital.  HOME CARE INSTRUCTIONS  If you have diabetes, follow your diabetes management plan by:  Taking your medicines as directed.  Following your exercise plan.  Following your meal plan. Do not skip meals. Eat on time.  Testing your blood glucose regularly. Check your blood glucose before and after exercise. If you exercise longer or different than usual, be sure to check blood glucose more frequently.  Wearing your medical alert jewelry that says you have diabetes.  Identify the cause of your hypoglycemia. Then, develop ways to prevent the recurrence of hypoglycemia.  Do not take a hot bath or shower right after an insulin shot.  Always carry treatment with you. Glucose tablets are the easiest to carry.  If you are going to drink alcohol, drink it only with meals.  Tell friends or family members ways to keep you safe during a seizure. This may include removing hard or sharp objects from the area or turning you on your side.  Maintain a healthy weight. SEEK MEDICAL CARE IF:   You are having problems keeping your blood glucose in your target range.  You are having frequent episodes of hypoglycemia.  You feel you  might be having side effects from your medicines.  You are not sure why your blood glucose is dropping so low.  You notice a change in vision or a new problem with your vision. SEEK IMMEDIATE MEDICAL CARE IF:   Confusion develops.  A change in mental status occurs.  The inability to swallow develops.  Fainting occurs. Document Released: 03/24/2005 Document Revised: 03/29/2013 Document Reviewed: 07/21/2011 [facility] Patient Information 2015 [place], [place]. This information is not intended to replace advice given to you by your health care provider. Make sure you discuss any questions you have with your health care provider.

## 2013-11-25 NOTE — ED Provider Notes (Signed)
CSN: [id number]     Arrival date & time 11/25/13  2031 History   This chart was scribed for Janet Lyonsouglas Dayzee Trower, MD, by Yevette EdwardsAngela Bracken, ED Scribe. This patient was seen in room APA14/APA14 and the patient's care was started at 8:53 PM. First MD Initiated Contact with Patient 11/25/13 2047     No chief complaint on file.   The history is provided by the patient. No language interpreter was used.   HPI Comments: [name] is a 80 y.o. female, with a h/o hypoglycemia and headaches, who presents to the Emergency Department complaining of weakness which began today and worsened four hours ago. The pt also endorses a dull headache, onset a week. She took two glucose tablets prior to arrival and her capillary glucose is 150 in the ED. She denies a fever, abdominal pain, chest pain, numbness, lower extremity swelling, and dysuria. She also denies recent head impact. The pt takes Xarelto. [name] reports she does not eat a lot at baseline.   Past Medical History  Diagnosis Date  . Hypertension   . Hypoglycemia   . Chronic headache   . Sinusitis    Past Surgical History  Procedure Laterality Date  . Appendectomy     History reviewed. No pertinent family history. History  Substance Use Topics  . Smoking status: Never Smoker   . Smokeless tobacco: Not on file  . Alcohol Use: No   No OB history provided.  Review of Systems  Constitutional: Negative for fever.  Cardiovascular: Negative for chest pain and leg swelling.  Gastrointestinal: Negative for abdominal pain.  Genitourinary: Negative for dysuria.  Neurological: Positive for weakness and headaches. Negative for numbness.  All other systems reviewed and are negative.   Allergies  Codeine  Home Medications   Prior to Admission medications   Medication Sig Start Date End Date Taking? Authorizing Provider  amLODipine (NORVASC) 5 MG tablet Take 5 mg by mouth daily.    Historical Provider, MD  ibuprofen (ADVIL,MOTRIN) 200 MG tablet  Take 200 mg by mouth every 6 (six) hours as needed for pain.    Historical Provider, MD  losartan (COZAAR) 50 MG tablet Take 50 mg by mouth daily.    Historical Provider, MD  rivaroxaban (XARELTO) 20 MG TABS tablet Take 20 mg by mouth at bedtime.    Historical Provider, MD   Triage Vitals: BP 168/84  Pulse 88  Temp(Src) 98.7 F (37.1 C)  Resp 20  Ht 5\' 2"  (1.575 m)  Wt 160 lb (72.576 kg)  BMI 29.26 kg/m2  SpO2 95%  Physical Exam  Nursing note and vitals reviewed. Constitutional: She is oriented to person, place, and time. She appears well-developed and well-nourished. No distress.  HENT:  Head: Normocephalic and atraumatic.  Mouth/Throat: Oropharynx is clear and moist. No oropharyngeal exudate.  Eyes: Conjunctivae and EOM are normal. Pupils are equal, round, and reactive to light.  Neck: Normal range of motion. Neck supple. No tracheal deviation present.  Cardiovascular: Normal rate, regular rhythm and normal heart sounds.   Pulmonary/Chest: Effort normal and breath sounds normal. No respiratory distress.  Abdominal: Soft. There is no tenderness.  Musculoskeletal: Normal range of motion.  Neurological: She is alert and oriented to person, place, and time. No cranial nerve deficit. She exhibits normal muscle tone. Coordination normal.  Skin: Skin is warm and dry.  Psychiatric: She has a normal mood and affect. Her behavior is normal.    ED Course  Procedures (including critical care time)  DIAGNOSTIC STUDIES: Oxygen Saturation is 95% on room air, normal by my interpretation.    COORDINATION OF CARE:  8:58 PM- Discussed treatment plan with patient, and the patient agreed to the plan. The plan includes a CT scan, EKG, and lab work.   10:25 PM- Rechecked pt. Informed pt of lab findings and imaging results.   Labs Review Labs Reviewed  CBC WITH DIFFERENTIAL - Abnormal; Notable for the following:    RDW 15.6 (*)    All other components within normal limits  BASIC METABOLIC  PANEL - Abnormal; Notable for the following:    Glucose, Bld 140 (*)    GFR calc non Af Amer 72 (*)    GFR calc Af Amer 84 (*)    All other components within normal limits  CBG MONITORING, ED - Abnormal; Notable for the following:    Glucose-Capillary 150 (*)    All other components within normal limits  TROPONIN I  URINALYSIS, ROUTINE W REFLEX MICROSCOPIC    Imaging Review Ct Head Wo Contrast  11/25/2013   CLINICAL DATA:  Headache.  Xarelto use  EXAM: CT HEAD WITHOUT CONTRAST  TECHNIQUE: Contiguous axial images were obtained from the base of the skull through the vertex without intravenous contrast.  COMPARISON:  06/21/2012  FINDINGS: Skull and Sinuses:Negative for fracture or destructive process. The mastoids, middle ears, and imaged paranasal sinuses are clear.  Orbits: Bilateral cataract resection.  Brain: No evidence of acute abnormality, such as acute infarction, hemorrhage, hydrocephalus, or mass lesion/mass effect. Extensive chronic small vessel disease, pattern unchanged from 2014. Ischemic gliosis is present throughout the deep and subcortical cerebral white matter bilaterally. Cerebral volume loss which is age appropriate.  IMPRESSION: 1. No acute intracranial findings. 2. Extensive, chronic small vessel disease.   Electronically Signed   By: Tiburcio Pea M.D.   On: 11/25/2013 21:39     EKG Interpretation   Date/Time:  Friday November 25 2013 21:14:21 EDT Ventricular Rate:  87 PR Interval:  169 QRS Duration: 97 QT Interval:  352 QTC Calculation: 423 R Axis:   -41 Text Interpretation:  Sinus rhythm Left axis deviation Abnormal R-wave  progression, late transition Abnormal T, consider ischemia, laeral leads N  Confirmed by DELOS  MD, Skiler Tye ([id number]) on 11/25/2013 10:29:48 PM      MDM   Final diagnoses:  None    Patient is a 80 year old female who presents with complaints of not feeling well since yesterday. She also reports a headache for the past week. She is  currently taking blood thinners but denies any injury or trauma. She thought her blood sugar was low at home so she took 2 glucose tablets and presented here for evaluation.  Her blood sugar was 150 upon arrival. Her neurologic exam is nonfocal and physical examination is essentially unremarkable. CT scan of the head reveals no evidence for intracranial hemorrhage and laboratory studies showed no acute abnormalities. She was observed for several hours and appears well. I do not feel as though further workup is indicated and she will be discharged to home. She will return if her symptoms substantially worsen or change.  I personally performed the services described in this documentation, which was scribed in my presence. The recorded information has been reviewed and is accurate.      Janet Lyons, MD 11/25/13 2231

## 2014-01-04 ENCOUNTER — Encounter (HOSPITAL_COMMUNITY): Payer: Self-pay | Admitting: Emergency Medicine

## 2014-01-04 ENCOUNTER — Emergency Department (HOSPITAL_COMMUNITY): Payer: Medicare PPO

## 2014-01-04 ENCOUNTER — Emergency Department (HOSPITAL_COMMUNITY)
Admission: EM | Admit: 2014-01-04 | Discharge: 2014-01-04 | Disposition: A | Discharge status: Home / Self Care | Payer: Medicare PPO | Attending: Emergency Medicine | Admitting: Emergency Medicine

## 2014-01-04 DIAGNOSIS — Z86711 Personal history of pulmonary embolism: Secondary | ICD-10-CM | POA: Insufficient documentation

## 2014-01-04 DIAGNOSIS — Z8709 Personal history of other diseases of the respiratory system: Secondary | ICD-10-CM | POA: Diagnosis not present

## 2014-01-04 DIAGNOSIS — R51 Headache: Secondary | ICD-10-CM | POA: Diagnosis not present

## 2014-01-04 DIAGNOSIS — G8929 Other chronic pain: Secondary | ICD-10-CM | POA: Diagnosis not present

## 2014-01-04 DIAGNOSIS — Z862 Personal history of diseases of the blood and blood-forming organs and certain disorders involving the immune mechanism: Secondary | ICD-10-CM | POA: Diagnosis not present

## 2014-01-04 DIAGNOSIS — R5381 Other malaise: Secondary | ICD-10-CM | POA: Diagnosis not present

## 2014-01-04 DIAGNOSIS — I10 Essential (primary) hypertension: Secondary | ICD-10-CM | POA: Insufficient documentation

## 2014-01-04 DIAGNOSIS — Z79899 Other long term (current) drug therapy: Secondary | ICD-10-CM | POA: Insufficient documentation

## 2014-01-04 DIAGNOSIS — Z8639 Personal history of other endocrine, nutritional and metabolic disease: Secondary | ICD-10-CM | POA: Diagnosis not present

## 2014-01-04 DIAGNOSIS — R4182 Altered mental status, unspecified: Secondary | ICD-10-CM | POA: Diagnosis present

## 2014-01-04 DIAGNOSIS — N390 Urinary tract infection, site not specified: Secondary | ICD-10-CM | POA: Insufficient documentation

## 2014-01-04 DIAGNOSIS — Z7901 Long term (current) use of anticoagulants: Secondary | ICD-10-CM | POA: Insufficient documentation

## 2014-01-04 DIAGNOSIS — R5383 Other fatigue: Secondary | ICD-10-CM

## 2014-01-04 HISTORY — DX: Other pulmonary embolism without acute cor pulmonale: I26.99

## 2014-01-04 LAB — COMPREHENSIVE METABOLIC PANEL
ALBUMIN: 3.7 g/dL (ref 3.5–5.2)
ALT: 10 U/L (ref 0–35)
ANION GAP: 10 (ref 5–15)
AST: 16 U/L (ref 0–37)
Alkaline Phosphatase: 61 U/L (ref 39–117)
BILIRUBIN TOTAL: 0.6 mg/dL (ref 0.3–1.2)
BUN: 18 mg/dL (ref 6–23)
CALCIUM: 8.4 mg/dL (ref 8.4–10.5)
CO2: 26 mEq/L (ref 19–32)
CREATININE: 0.68 mg/dL (ref 0.50–1.10)
Chloride: 104 mEq/L (ref 96–112)
GFR calc Af Amer: 85 mL/min — ABNORMAL LOW (ref >90)
GFR calc non Af Amer: 74 mL/min — ABNORMAL LOW (ref >90)
Glucose, Bld: 91 mg/dL (ref 70–99)
Potassium: 4.2 mEq/L (ref 3.7–5.3)
Sodium: 140 mEq/L (ref 137–147)
TOTAL PROTEIN: 6.7 g/dL (ref 6.0–8.3)

## 2014-01-04 LAB — CBC WITH DIFFERENTIAL/PLATELET
BASOS ABS: 0 10*3/uL (ref 0.0–0.1)
BASOS PCT: 1 % (ref 0–1)
EOS ABS: 0.1 10*3/uL (ref 0.0–0.7)
Eosinophils Relative: 3 % (ref 0–5)
HCT: 43.1 % (ref 36.0–46.0)
Hemoglobin: 14.4 g/dL (ref 12.0–15.0)
Lymphocytes Relative: 39 % (ref 12–46)
Lymphs Abs: 1.9 10*3/uL (ref 0.7–4.0)
MCH: 31.2 pg (ref 26.0–34.0)
MCHC: 33.4 g/dL (ref 30.0–36.0)
MCV: 93.5 fL (ref 78.0–100.0)
MONO ABS: 0.4 10*3/uL (ref 0.1–1.0)
Monocytes Relative: 9 % (ref 3–12)
Neutro Abs: 2.4 10*3/uL (ref 1.7–7.7)
Neutrophils Relative %: 48 % (ref 43–77)
Platelets: 154 10*3/uL (ref 150–400)
RBC: 4.61 MIL/uL (ref 3.87–5.11)
RDW: 14.6 % (ref 11.5–15.5)
WBC: 4.9 10*3/uL (ref 4.0–10.5)

## 2014-01-04 LAB — URINALYSIS, ROUTINE W REFLEX MICROSCOPIC
BILIRUBIN URINE: NEGATIVE
Glucose, UA: NEGATIVE mg/dL
HGB URINE DIPSTICK: NEGATIVE
KETONES UR: NEGATIVE mg/dL
Nitrite: NEGATIVE
PROTEIN: NEGATIVE mg/dL
Specific Gravity, Urine: 1.01 (ref 1.005–1.030)
UROBILINOGEN UA: 0.2 mg/dL (ref 0.0–1.0)
pH: 6 (ref 5.0–8.0)

## 2014-01-04 LAB — TROPONIN I

## 2014-01-04 LAB — URINE MICROSCOPIC-ADD ON

## 2014-01-04 LAB — PROTIME-INR
INR: 1.1 (ref 0.00–1.49)
PROTHROMBIN TIME: 14.2 s (ref 11.6–15.2)

## 2014-01-04 LAB — APTT: APTT: 31 s (ref 24–37)

## 2014-01-04 LAB — D-DIMER, QUANTITATIVE (NOT AT ARMC): D DIMER QUANT: 0.32 ug{FEU}/mL (ref 0.00–0.48)

## 2014-01-04 MED ORDER — CEPHALEXIN 500 MG PO CAPS: 500.0000 mg | ORAL_CAPSULE | Freq: Three times a day (TID) | ORAL | Status: DC

## 2014-01-04 MED ORDER — DEXTROSE 5 % IV SOLN
1.0000 g | Freq: Once | INTRAVENOUS | Status: AC
  Administered 2014-01-04: 1 g via INTRAVENOUS
  Filled 2014-01-04: qty 10

## 2014-01-04 NOTE — Discharge Instructions (Signed)
Drink plenty of fluids. Take the antibiotic until gone. Have Dr Lorenso CourierMcInnis's office call to get the results of your urine culture on Friday. Return to the ED if you feel worse such as fever, abdominal pain, vomiting, chest pain or shortness of breath or you feel worse.

## 2014-01-04 NOTE — ED Notes (Signed)
Pt ambulated to restroom with 1 assist. O2 saturation 94%. nad noted.

## 2014-01-04 NOTE — ED Provider Notes (Addendum)
CSN: [id number]     Arrival date & time 01/04/14  1716 History   First MD Initiated Contact with Patient 01/04/14 1721     Chief Complaint  Patient presents with  . Altered Mental Status     (Consider location/radiation/quality/duration/timing/severity/associated sxs/prior Treatment) HPI Patient reports she felt fine yesterday. She states today she has felt tired and sleepy all day. She states she thought maybe her blood sugar was low and she took some sugar tablets without relief. She states she slept fine on it. She denies having any fever, chills, nausea, vomiting, diarrhea, cough, or any type of pain. She denies dysuria or frequency. She states she ate normally today and her son agrees. Her son states about 2 years ago she was sleepy and she had a low pulse ox when she walked and they diagnosed her with a pulmonary embolus. She has been on xarelto for 2 years. She denies chest pain or shortness of breath today. She states she has chronic swelling of her left ankle for years. She denies any type of pain.  PCP Dr Renard Matter  Past Medical History  Diagnosis Date  . Hypertension   . Hypoglycemia   . Chronic headache   . Sinusitis   . Pulmonary embolism    Past Surgical History  Procedure Laterality Date  . Appendectomy     No family history on file. History  Substance Use Topics  . Smoking status: Never Smoker   . Smokeless tobacco: Not on file  . Alcohol Use: No   Lives with son   OB History   Grav Para Term Preterm Abortions TAB SAB Ect Mult Living                 Review of Systems  All other systems reviewed and are negative.     Allergies  Codeine  Home Medications   Prior to Admission medications   Medication Sig Start Date End Date Taking? Authorizing Provider  amLODipine (NORVASC) 5 MG tablet Take 5 mg by mouth every evening.    Yes Historical Provider, MD  ibuprofen (ADVIL,MOTRIN) 200 MG tablet Take 200 mg by mouth every 6 (six) hours as needed for mild  pain or moderate pain.   Yes Historical Provider, MD  LORazepam (ATIVAN) 0.5 MG tablet Take 1 tablet by mouth every 6 (six) hours as needed. For anxiety/sleep 11/02/13  Yes Historical Provider, MD  losartan (COZAAR) 50 MG tablet Take 50 mg by mouth every evening.    Yes Historical Provider, MD  rivaroxaban (XARELTO) 20 MG TABS tablet Take 20 mg by mouth at bedtime.   Yes Historical Provider, MD   BP 167/90  Pulse 80  Temp(Src) 98.5 F (36.9 C) (Oral)  Resp 18  Ht 5\' 2"  (1.575 m)  Wt 160 lb (72.576 kg)  BMI 29.26 kg/m2  SpO2 95%  Vital signs normal   Physical Exam  Nursing note and vitals reviewed. Constitutional: She is oriented to person, place, and time. She appears well-developed and well-nourished.  Non-toxic appearance. She does not appear ill. No distress.  Pleasant elderly female who answers all my questions  HENT:  Head: Normocephalic and atraumatic.  Right Ear: External ear normal.  Left Ear: External ear normal.  Nose: Nose normal. No mucosal edema or rhinorrhea.  Mouth/Throat: Oropharynx is clear and moist and mucous membranes are normal. No dental abscesses or uvula swelling.  Eyes: Conjunctivae and EOM are normal. Pupils are equal, round, and reactive to light.  Neck: Normal range of  motion and full passive range of motion without pain. Neck supple.  Cardiovascular: Normal rate, regular rhythm and normal heart sounds.  Exam reveals no gallop and no friction rub.   No murmur heard. Pulmonary/Chest: Effort normal and breath sounds normal. No respiratory distress. She has no wheezes. She has no rhonchi. She has no rales. She exhibits no tenderness and no crepitus.  Abdominal: Soft. Normal appearance and bowel sounds are normal. She exhibits no distension. There is no tenderness. There is no rebound and no guarding.  Musculoskeletal: Normal range of motion. She exhibits no edema and no tenderness.  Moves all extremities well.   Neurological: She is alert and oriented to  person, place, and time. She has normal strength. No cranial nerve deficit.  Skin: Skin is warm, dry and intact. No rash noted. No erythema. No pallor.  Psychiatric: She has a normal mood and affect. Her speech is normal and behavior is normal. Her mood appears not anxious.    ED Course  Procedures (including critical care time)  Medications  cefTRIAXone (ROCEPHIN) 1 g in dextrose 5 % 50 mL IVPB (0 g Intravenous Stopped 01/04/14 2038)    Patient ambulated by nursing staff and her pulse ox remained 94% on room air.  Patient and son given her test results. She will be treated for a UTI which will hopefully improve her symptoms. At this point patient does not need to be admitted. She's eating well at home without nausea or vomiting. She is steady on her feet. Her pulse ox remained 94% when she ambulated to the bathroom and the staff noted she was in no distress.  Labs Review Results for orders placed during the hospital encounter of 01/04/14  CBC WITH DIFFERENTIAL      Result Value Ref Range   WBC 4.9  4.0 - 10.5 K/uL   RBC 4.61  3.87 - 5.11 MIL/uL   Hemoglobin 14.4  12.0 - 15.0 g/dL   HCT 13.0  86.5 - 78.4 %   MCV 93.5  78.0 - 100.0 fL   MCH 31.2  26.0 - 34.0 pg   MCHC 33.4  30.0 - 36.0 g/dL   RDW 69.6  29.5 - 28.4 %   Platelets 154  150 - 400 K/uL   Neutrophils Relative % 48  43 - 77 %   Neutro Abs 2.4  1.7 - 7.7 K/uL   Lymphocytes Relative 39  12 - 46 %   Lymphs Abs 1.9  0.7 - 4.0 K/uL   Monocytes Relative 9  3 - 12 %   Monocytes Absolute 0.4  0.1 - 1.0 K/uL   Eosinophils Relative 3  0 - 5 %   Eosinophils Absolute 0.1  0.0 - 0.7 K/uL   Basophils Relative 1  0 - 1 %   Basophils Absolute 0.0  0.0 - 0.1 K/uL  COMPREHENSIVE METABOLIC PANEL      Result Value Ref Range   Sodium 140  137 - 147 mEq/L   Potassium 4.2  3.7 - 5.3 mEq/L   Chloride 104  96 - 112 mEq/L   CO2 26  19 - 32 mEq/L   Glucose, Bld 91  70 - 99 mg/dL   BUN 18  6 - 23 mg/dL   Creatinine, Ser 1.32  0.50 - 1.10  mg/dL   Calcium 8.4  8.4 - 44.0 mg/dL   Total Protein 6.7  6.0 - 8.3 g/dL   Albumin 3.7  3.5 - 5.2 g/dL   AST 16  0 - 37 U/L   ALT 10  0 - 35 U/L   Alkaline Phosphatase 61  39 - 117 U/L   Total Bilirubin 0.6  0.3 - 1.2 mg/dL   GFR calc non Af Amer 74 (*) >90 mL/min   GFR calc Af Amer 85 (*) >90 mL/min   Anion gap 10  5 - 15  TROPONIN I      Result Value Ref Range   Troponin I <0.30  <0.30 ng/mL  D-DIMER, QUANTITATIVE      Result Value Ref Range   D-Dimer, Quant 0.32  0.00 - 0.48 ug/mL-FEU  APTT      Result Value Ref Range   aPTT 31  24 - 37 seconds  PROTIME-INR      Result Value Ref Range   Prothrombin Time 14.2  11.6 - 15.2 seconds   INR 1.10  0.00 - 1.49  URINALYSIS, ROUTINE W REFLEX MICROSCOPIC      Result Value Ref Range   Color, Urine YELLOW  YELLOW   APPearance CLEAR  CLEAR   Specific Gravity, Urine 1.010  1.005 - 1.030   pH 6.0  5.0 - 8.0   Glucose, UA NEGATIVE  NEGATIVE mg/dL   Hgb urine dipstick NEGATIVE  NEGATIVE   Bilirubin Urine NEGATIVE  NEGATIVE   Ketones, ur NEGATIVE  NEGATIVE mg/dL   Protein, ur NEGATIVE  NEGATIVE mg/dL   Urobilinogen, UA 0.2  0.0 - 1.0 mg/dL   Nitrite NEGATIVE  NEGATIVE   Leukocytes, UA SMALL (*) NEGATIVE  URINE MICROSCOPIC-ADD ON      Result Value Ref Range   Squamous Epithelial / LPF RARE  RARE   WBC, UA 3-6  <3 WBC/hpf   Bacteria, UA RARE  RARE   Laboratory interpretation all normal except possible UTI   Imaging Review Dg Chest 2 View  01/04/2014   CLINICAL DATA:  Fatigue and weakness  EXAM: CHEST  2 VIEW  COMPARISON:  10/26/2013  FINDINGS: There is moderate cardiac enlargement. No pleural effusion identified. There is no airspace consolidation identified. Mild spondylosis identified within the thoracic spine.  IMPRESSION: 1. Cardiac enlargement. 2. No acute findings   Electronically Signed   By: Signa Kellaylor  Stroud M.D.   On: 01/04/2014 19:37     EKG Interpretation   Date/Time:  Wednesday January 04 2014 17:38:02  EDT Ventricular Rate:  78 PR Interval:  160 QRS Duration: 91 QT Interval:  373 QTC Calculation: 425 R Axis:   -46 Text Interpretation:  Sinus rhythm LAD, consider left anterior fascicular  block Abnormal R-wave progression, late transition Abnormal T, consider  ischemia, lateral leads No significant change since last tracing 25 Nov 2013 Confirmed by [facility]  MD-I, Marijo Quizon ([id number]) on 01/04/2014 6:16:18 PM      MDM   Final diagnoses:  Other fatigue  Urinary tract infection without hematuria, site unspecified    Discharge Medication List as of 01/04/2014  8:22 PM    START taking these medications   Details  cephALEXin (KEFLEX) 500 MG capsule Take 1 capsule (500 mg total) by mouth 3 (three) times daily., Starting 01/04/2014, Until Discontinued, Print        Plan discharge  Devoria AlbeIva Alston Berrie, MD, Franz DellFACEP     Denora Wysocki L Isam Unrein, MD 01/04/14 [id number]  Ward GivensIva L Soliyana Mcchristian, MD 01/04/14 2211

## 2014-01-04 NOTE — ED Notes (Addendum)
Pt reports she has been feeling increasingly droswy today states she can't hold her eyes open. Pt is awake, alert, and oriented. Pt ambulated in ED. Pt denies any weakness, pain or other sx at this time.

## 2014-01-04 NOTE — ED Notes (Signed)
Dr.Knapp at bedside  

## 2014-01-06 LAB — URINE CULTURE
COLONY COUNT: NO GROWTH
Culture: NO GROWTH

## 2014-04-04 ENCOUNTER — Emergency Department (HOSPITAL_COMMUNITY)
Admission: EM | Admit: 2014-04-04 | Discharge: 2014-04-05 | Disposition: A | Discharge status: Home / Self Care | Payer: Medicare HMO | Attending: Emergency Medicine | Admitting: Emergency Medicine

## 2014-04-04 ENCOUNTER — Encounter (HOSPITAL_COMMUNITY): Payer: Self-pay | Admitting: *Deleted

## 2014-04-04 DIAGNOSIS — I10 Essential (primary) hypertension: Secondary | ICD-10-CM | POA: Diagnosis not present

## 2014-04-04 DIAGNOSIS — R531 Weakness: Secondary | ICD-10-CM | POA: Diagnosis present

## 2014-04-04 DIAGNOSIS — G8929 Other chronic pain: Secondary | ICD-10-CM | POA: Diagnosis not present

## 2014-04-04 DIAGNOSIS — Z86711 Personal history of pulmonary embolism: Secondary | ICD-10-CM | POA: Diagnosis not present

## 2014-04-04 DIAGNOSIS — Z8639 Personal history of other endocrine, nutritional and metabolic disease: Secondary | ICD-10-CM | POA: Insufficient documentation

## 2014-04-04 LAB — CBC WITH DIFFERENTIAL/PLATELET
Basophils Absolute: 0 10*3/uL (ref 0.0–0.1)
Basophils Relative: 0 % (ref 0–1)
EOS ABS: 0.1 10*3/uL (ref 0.0–0.7)
EOS PCT: 2 % (ref 0–5)
HCT: 45.5 % (ref 36.0–46.0)
Hemoglobin: 15.1 g/dL — ABNORMAL HIGH (ref 12.0–15.0)
Lymphocytes Relative: 39 % (ref 12–46)
Lymphs Abs: 2.6 10*3/uL (ref 0.7–4.0)
MCH: 30.8 pg (ref 26.0–34.0)
MCHC: 33.2 g/dL (ref 30.0–36.0)
MCV: 92.9 fL (ref 78.0–100.0)
MONOS PCT: 7 % (ref 3–12)
Monocytes Absolute: 0.5 10*3/uL (ref 0.1–1.0)
NEUTROS PCT: 52 % (ref 43–77)
Neutro Abs: 3.4 10*3/uL (ref 1.7–7.7)
Platelets: 164 10*3/uL (ref 150–400)
RBC: 4.9 MIL/uL (ref 3.87–5.11)
RDW: 14.7 % (ref 11.5–15.5)
WBC: 6.6 10*3/uL (ref 4.0–10.5)

## 2014-04-04 LAB — COMPREHENSIVE METABOLIC PANEL
ALT: 14 U/L (ref 0–35)
ANION GAP: 5 (ref 5–15)
AST: 19 U/L (ref 0–37)
Albumin: 4 g/dL (ref 3.5–5.2)
Alkaline Phosphatase: 66 U/L (ref 39–117)
BUN: 18 mg/dL (ref 6–23)
CALCIUM: 9 mg/dL (ref 8.4–10.5)
CO2: 27 mmol/L (ref 19–32)
Chloride: 109 mEq/L (ref 96–112)
Creatinine, Ser: 0.7 mg/dL (ref 0.50–1.10)
GFR calc non Af Amer: 73 mL/min — ABNORMAL LOW (ref >90)
GFR, EST AFRICAN AMERICAN: 85 mL/min — AB (ref >90)
Glucose, Bld: 109 mg/dL — ABNORMAL HIGH (ref 70–99)
Potassium: 4.2 mmol/L (ref 3.5–5.1)
SODIUM: 141 mmol/L (ref 135–145)
TOTAL PROTEIN: 6.6 g/dL (ref 6.0–8.3)
Total Bilirubin: 0.4 mg/dL (ref 0.3–1.2)

## 2014-04-04 LAB — CBG MONITORING, ED: Glucose-Capillary: 119 mg/dL — ABNORMAL HIGH (ref 70–99)

## 2014-04-04 NOTE — ED Provider Notes (Addendum)
CSN: [id number]     Arrival date & time 04/04/14  2019 History  This chart was scribed for Gilda Creasehristopher J. Marae Cottrell, MD by [place] Peace, ED Scribe. The patient was seen in [facility]. The patient's care was started at 11:10 PM.    Chief Complaint  Patient presents with  . Weakness      Patient is a 80 y.o. female presenting with weakness. The history is provided by the patient. No language interpreter was used.  Weakness This is a new problem. The current episode started 3 to 5 hours ago. The problem has been gradually improving. Pertinent negatives include no chest pain, no abdominal pain, no headaches and no shortness of breath. The symptoms are relieved by lying down. She has tried nothing for the symptoms.    HPI Comments: [name] is a 80 y.o. female who presents to the Emergency Department complaining of generalized weakness onset earlier today. Pt reports that she normally feels weak but states today was worse. Pt reports that she is currently feeling a little bit better since she has been laying down. No complaints of vomiting, diarrhea, chest pain, SOB, or fever. She further denies any headaches or changes in vision.   Past Medical History  Diagnosis Date  . Hypertension   . Hypoglycemia   . Chronic headache   . Sinusitis   . Pulmonary embolism    Past Surgical History  Procedure Laterality Date  . Appendectomy     History reviewed. No pertinent family history. History  Substance Use Topics  . Smoking status: Never Smoker   . Smokeless tobacco: Not on file  . Alcohol Use: No   OB History    No data available     Review of Systems  Eyes: Negative for visual disturbance.  Respiratory: Negative for shortness of breath.   Cardiovascular: Negative for chest pain.  Gastrointestinal: Negative for vomiting, abdominal pain and diarrhea.  Neurological: Positive for weakness. Negative for headaches.  All other systems reviewed and are negative.     Allergies   Codeine  Home Medications   Prior to Admission medications   Medication Sig Start Date End Date Taking? Authorizing Provider  amLODipine (NORVASC) 5 MG tablet Take 5 mg by mouth every evening.    Yes Historical Provider, MD  ibuprofen (ADVIL,MOTRIN) 200 MG tablet Take 200 mg by mouth every 6 (six) hours as needed for mild pain or moderate pain.   Yes Historical Provider, MD  LORazepam (ATIVAN) 0.5 MG tablet Take 1 tablet by mouth every 6 (six) hours as needed. For anxiety/sleep 11/02/13  Yes Historical Provider, MD  losartan (COZAAR) 50 MG tablet Take 50 mg by mouth every evening.    Yes Historical Provider, MD  rivaroxaban (XARELTO) 20 MG TABS tablet Take 20 mg by mouth at bedtime.   Yes Historical Provider, MD  cephALEXin (KEFLEX) 500 MG capsule Take 1 capsule (500 mg total) by mouth 3 (three) times daily. Patient not taking: Reported on 04/04/2014 01/04/14   Ward GivensIva L Knapp, MD   BP 170/75 mmHg  Pulse 89  Temp(Src) 97.7 F (36.5 C) (Oral)  Resp 22  Ht 5\' 2"  (1.575 m)  Wt 160 lb (72.576 kg)  BMI 29.26 kg/m2  SpO2 97% Physical Exam  Constitutional: She is oriented to person, place, and time. She appears well-developed and well-nourished. No distress.  HENT:  Head: Normocephalic and atraumatic.  Right Ear: Hearing normal.  Left Ear: Hearing normal.  Nose: Nose normal.  Mouth/Throat: Oropharynx is clear  and moist and mucous membranes are normal.  Eyes: Conjunctivae and EOM are normal. Pupils are equal, round, and reactive to light.  Neck: Normal range of motion. Neck supple.  Cardiovascular: Regular rhythm, S1 normal and S2 normal.  Exam reveals no gallop and no friction rub.   No murmur heard. Pulmonary/Chest: Effort normal and breath sounds normal. No respiratory distress. She exhibits no tenderness.  Abdominal: Soft. Normal appearance and bowel sounds are normal. There is no hepatosplenomegaly. There is no tenderness. There is no rebound, no guarding, no tenderness at McBurney's  point and negative Murphy's sign. No hernia.  Musculoskeletal: Normal range of motion.  Neurological: She is alert and oriented to person, place, and time. She has normal strength. No cranial nerve deficit or sensory deficit. Coordination normal. GCS eye subscore is 4. GCS verbal subscore is 5. GCS motor subscore is 6.  Skin: Skin is warm, dry and intact. No rash noted. No cyanosis.  Psychiatric: She has a normal mood and affect. Her speech is normal and behavior is normal. Thought content normal.  Nursing note and vitals reviewed.   ED Course  Procedures (including critical care time) Labs Review Labs Reviewed  CBC WITH DIFFERENTIAL - Abnormal; Notable for the following:    Hemoglobin 15.1 (*)    All other components within normal limits  COMPREHENSIVE METABOLIC PANEL - Abnormal; Notable for the following:    Glucose, Bld 109 (*)    GFR calc non Af Amer 73 (*)    GFR calc Af Amer 85 (*)    All other components within normal limits  URINALYSIS, ROUTINE W REFLEX MICROSCOPIC - Abnormal; Notable for the following:    Specific Gravity, Urine <1.005 (*)    Leukocytes, UA TRACE (*)    All other components within normal limits  URINE MICROSCOPIC-ADD ON - Abnormal; Notable for the following:    Squamous Epithelial / LPF FEW (*)    Bacteria, UA FEW (*)    All other components within normal limits  CBG MONITORING, ED - Abnormal; Notable for the following:    Glucose-Capillary 119 (*)    All other components within normal limits    Imaging Review No results found.   EKG Interpretation   Date/Time:  Wednesday April 05 2014 00:33:51 EST Ventricular Rate:  84 PR Interval:  161 QRS Duration: 95 QT Interval:  352 QTC Calculation: 416 R Axis:   -39 Text Interpretation:  Sinus rhythm Left axis deviation Nonspecific T  abnrm, anterolateral leads Baseline wander in lead(s) V2 No significant  change since last tracing Confirmed by Jeanet Lupe  MD, Oland Arquette ([id number]) on  04/05/2014  12:48:32 AM     Medications - No data to display  11:13 PM- Treatment plan was discussed with patient who verbalizes understanding and agrees. MDM   Final diagnoses:  None   underlies weakness  Patient presents to the ER for evaluation of generalized weakness. She does not have any unilateral or focal weakness. Patient's lab work is unremarkable. She has a normal white blood cell count of 6.6. Hemoglobin is 15.1. She has not had any bleeding. Renal function, electrolytes are all normal. Urinalysis performed, no obvious sign of infection. Will obtain culture. Cause of patient's weakness is unclear. She has a normal neurologic examination here in the ER. She will be discharged, to follow-up with primary doctor in the office.  I personally performed the services described in this documentation, which was scribed in my presence. The recorded information has been reviewed and is accurate.  Gilda Creasehristopher J. Calisa Luckenbaugh, MD 04/05/14 [id number]  Gilda Creasehristopher J. Azim Gillingham, MD 04/05/14 [phone]

## 2014-04-04 NOTE — ED Notes (Signed)
Says she feels weak , taking xarelto.after a PE.

## 2014-04-04 NOTE — ED Notes (Signed)
Pt. C/o generalized weakness. Denies any other symptoms. Pt. Ambulatory to room.

## 2014-04-05 LAB — URINE MICROSCOPIC-ADD ON

## 2014-04-05 LAB — URINALYSIS, ROUTINE W REFLEX MICROSCOPIC
BILIRUBIN URINE: NEGATIVE
Glucose, UA: NEGATIVE mg/dL
Hgb urine dipstick: NEGATIVE
Ketones, ur: NEGATIVE mg/dL
NITRITE: NEGATIVE
PH: 6.5 (ref 5.0–8.0)
Protein, ur: NEGATIVE mg/dL
UROBILINOGEN UA: 0.2 mg/dL (ref 0.0–1.0)

## 2014-04-05 NOTE — Discharge Instructions (Signed)

## 2014-04-06 LAB — URINE CULTURE: Colony Count: 6000

## 2014-10-14 ENCOUNTER — Emergency Department (HOSPITAL_COMMUNITY)
Admission: EM | Admit: 2014-10-14 | Discharge: 2014-10-14 | Disposition: A | Discharge status: Home / Self Care | Payer: Medicare HMO | Attending: Emergency Medicine | Admitting: Emergency Medicine

## 2014-10-14 ENCOUNTER — Encounter (HOSPITAL_COMMUNITY): Payer: Self-pay

## 2014-10-14 DIAGNOSIS — Z8709 Personal history of other diseases of the respiratory system: Secondary | ICD-10-CM | POA: Insufficient documentation

## 2014-10-14 DIAGNOSIS — Z8639 Personal history of other endocrine, nutritional and metabolic disease: Secondary | ICD-10-CM | POA: Diagnosis not present

## 2014-10-14 DIAGNOSIS — I10 Essential (primary) hypertension: Secondary | ICD-10-CM | POA: Insufficient documentation

## 2014-10-14 DIAGNOSIS — Z79899 Other long term (current) drug therapy: Secondary | ICD-10-CM | POA: Insufficient documentation

## 2014-10-14 DIAGNOSIS — Z86711 Personal history of pulmonary embolism: Secondary | ICD-10-CM | POA: Insufficient documentation

## 2014-10-14 DIAGNOSIS — G8929 Other chronic pain: Secondary | ICD-10-CM | POA: Insufficient documentation

## 2014-10-14 DIAGNOSIS — R5383 Other fatigue: Secondary | ICD-10-CM | POA: Insufficient documentation

## 2014-10-14 DIAGNOSIS — R531 Weakness: Secondary | ICD-10-CM | POA: Insufficient documentation

## 2014-10-14 LAB — URINALYSIS, ROUTINE W REFLEX MICROSCOPIC
Bilirubin Urine: NEGATIVE
GLUCOSE, UA: NEGATIVE mg/dL
Ketones, ur: NEGATIVE mg/dL
Nitrite: NEGATIVE
PH: 6.5 (ref 5.0–8.0)
PROTEIN: NEGATIVE mg/dL
Urobilinogen, UA: 0.2 mg/dL (ref 0.0–1.0)

## 2014-10-14 LAB — BASIC METABOLIC PANEL
Anion gap: 9 (ref 5–15)
BUN: 18 mg/dL (ref 6–20)
CALCIUM: 8.2 mg/dL — AB (ref 8.9–10.3)
CO2: 25 mmol/L (ref 22–32)
Chloride: 106 mmol/L (ref 101–111)
Creatinine, Ser: 0.65 mg/dL (ref 0.44–1.00)
GFR calc Af Amer: >60 mL/min (ref >60)
GLUCOSE: 107 mg/dL — AB (ref 65–99)
Potassium: 3.8 mmol/L (ref 3.5–5.1)
Sodium: 140 mmol/L (ref 135–145)

## 2014-10-14 LAB — CBC WITH DIFFERENTIAL/PLATELET
BASOS ABS: 0 10*3/uL (ref 0.0–0.1)
Basophils Relative: 0 % (ref 0–1)
Eosinophils Absolute: 0.1 10*3/uL (ref 0.0–0.7)
Eosinophils Relative: 3 % (ref 0–5)
HCT: 42.1 % (ref 36.0–46.0)
Hemoglobin: 13.9 g/dL (ref 12.0–15.0)
LYMPHS ABS: 2.1 10*3/uL (ref 0.7–4.0)
LYMPHS PCT: 40 % (ref 12–46)
MCH: 30.1 pg (ref 26.0–34.0)
MCHC: 33 g/dL (ref 30.0–36.0)
MCV: 91.1 fL (ref 78.0–100.0)
Monocytes Absolute: 0.5 10*3/uL (ref 0.1–1.0)
Monocytes Relative: 10 % (ref 3–12)
NEUTROS PCT: 47 % (ref 43–77)
Neutro Abs: 2.5 10*3/uL (ref 1.7–7.7)
PLATELETS: 146 10*3/uL — AB (ref 150–400)
RBC: 4.62 MIL/uL (ref 3.87–5.11)
RDW: 15.2 % (ref 11.5–15.5)
WBC: 5.3 10*3/uL (ref 4.0–10.5)

## 2014-10-14 LAB — TROPONIN I: Troponin I: <0.03 ng/mL (ref <0.031)

## 2014-10-14 LAB — CBG MONITORING, ED: Glucose-Capillary: 102 mg/dL — ABNORMAL HIGH (ref 65–99)

## 2014-10-14 LAB — URINE MICROSCOPIC-ADD ON

## 2014-10-14 NOTE — ED Notes (Signed)
Patient ambulated to restroom with no difficulty.

## 2014-10-14 NOTE — Discharge Instructions (Signed)

## 2014-10-14 NOTE — ED Provider Notes (Signed)
CSN: [id number]     Arrival date & time 10/14/14  0038 History   First MD Initiated Contact with Patient 10/14/14 0053     Chief Complaint  Patient presents with  . Weakness     Patient is a 81 y.o. female presenting with weakness. The history is provided by the patient and a relative.  Weakness This is a chronic problem. Episode onset: "awhile ago" but worse in past 24 hours. The problem occurs constantly. The problem has been gradually worsening. Pertinent negatives include no chest pain, no abdominal pain, no headaches and no shortness of breath. Nothing aggravates the symptoms. Nothing relieves the symptoms.  pt presents for generalized weakness She reports she has been told she has h/o hypoglycemia (pt is not diabetic) Pt reports tonight her chronic weakness appeared to worsen and she assumed she was hypoglycemic (no CBG performed) No new meds No cp/sob No cough No abd pain/dysuria No syncope   Past Medical History  Diagnosis Date  . Hypertension   . Hypoglycemia   . Chronic headache   . Sinusitis   . Pulmonary embolism    Past Surgical History  Procedure Laterality Date  . Appendectomy     No family history on file. History  Substance Use Topics  . Smoking status: Never Smoker   . Smokeless tobacco: Not on file  . Alcohol Use: No   OB History    No data available     Review of Systems  Constitutional: Positive for fatigue.  Respiratory: Negative for cough and shortness of breath.   Cardiovascular: Negative for chest pain.  Gastrointestinal: Negative for abdominal pain and blood in stool.  Genitourinary: Negative for dysuria.  Neurological: Positive for weakness. Negative for syncope and headaches.  All other systems reviewed and are negative.     Allergies  Codeine  Home Medications   Prior to Admission medications   Medication Sig Start Date End Date Taking? Authorizing Provider  amLODipine (NORVASC) 5 MG tablet Take 5 mg by mouth every evening.     Yes Historical Provider, MD  LORazepam (ATIVAN) 0.5 MG tablet Take 1 tablet by mouth every 6 (six) hours as needed. For anxiety/sleep 11/02/13  Yes Historical Provider, MD  losartan (COZAAR) 50 MG tablet Take 50 mg by mouth every evening.    Yes Historical Provider, MD  rivaroxaban (XARELTO) 20 MG TABS tablet Take 20 mg by mouth at bedtime.   Yes Historical Provider, MD   BP 150/68 mmHg  Pulse 83  Temp(Src) 98.4 F (36.9 C) (Oral)  Resp 18  Ht  (1.575 m)  Wt 150 lb (68.04 kg)  BMI 27.43 kg/m2  SpO2 96% Physical Exam CONSTITUTIONAL: Well developed/well nourished HEAD: Normocephalic/atraumatic EYES: EOMI/PERRL ENMT: Mucous membranes moist NECK: supple no meningeal signs SPINE/BACK:entire spine nontender CV: S1/S2 noted, no murmurs/rubs/gallops noted LUNGS: Lungs are clear to auscultation bilaterally, no apparent distress ABDOMEN: soft, nontender, no rebound or guarding, bowel sounds noted throughout abdomen GU:no cva tenderness NEURO: Pt is awake/alert/appropriate, moves all extremitiesx4.  No facial droop. No arm/leg drift  EXTREMITIES: pulses normal/equal, full ROM SKIN: warm, color normal PSYCH: no abnormalities of mood noted, alert and oriented to situation  ED Course  Procedures  1:40 AM Pt well appearing She is here for worsening of chronic generalized weakness She has no other complaints There is no hypoglycemia noted at this time 2:52 AM  Labs unremarkable Pt ambulatory without difficulty She is well appearing No signs of acute CVA I feel she is  safe/stable for d/c home BP 131/61 mmHg  Pulse 75  Temp(Src) 98.4 F (36.9 C) (Oral)  Resp 18  Ht 5\' 2"  (1.575 m)  Wt 150 lb (68.04 kg)  BMI 27.43 kg/m2  SpO2 95%  Labs Review Labs Reviewed  BASIC METABOLIC PANEL - Abnormal; Notable for the following:    Glucose, Bld 107 (*)    Calcium 8.2 (*)    All other components within normal limits  CBC WITH DIFFERENTIAL/PLATELET - Abnormal; Notable for the following:     Platelets 146 (*)    All other components within normal limits  URINALYSIS, ROUTINE W REFLEX MICROSCOPIC (NOT AT [facility]) - Abnormal; Notable for the following:    Specific Gravity, Urine <1.005 (*)    Hgb urine dipstick TRACE (*)    Leukocytes, UA SMALL (*)    All other components within normal limits  CBG MONITORING, ED - Abnormal; Notable for the following:    Glucose-Capillary 102 (*)    All other components within normal limits  TROPONIN I  URINE MICROSCOPIC-ADD ON    EKG Interpretation   Date/Time:  Saturday October 14 2014 01:02:06 EDT Ventricular Rate:  78 PR Interval:  144 QRS Duration: 88 QT Interval:  378 QTC Calculation: 430 R Axis:   -38 Text Interpretation:  Sinus rhythm with Premature supraventricular  complexes Left axis deviation Minimal voltage criteria for LVH, may be  normal variant ST \\T \ T wave abnormality, consider lateral ischemia  Abnormal ECG No significant change since last tracing Confirmed by  Bebe ShaggyWICKLINE  MD, Dorinda HillNALD ([id number]) on 10/14/2014 1:13:51 AM      MDM   Final diagnoses:  Weakness  Other fatigue    Nursing notes including past medical history and social history reviewed and considered in documentation Labs/vital reviewed myself and considered during evaluation Previous records reviewed and considered     Zadie Rhineonald Bard Haupert, MD 10/14/14 [phone]

## 2014-10-14 NOTE — ED Notes (Signed)
Pt states she feels weak, very sleepy, denies n/v/d/

## 2014-10-17 ENCOUNTER — Emergency Department (HOSPITAL_COMMUNITY): Payer: Medicare HMO

## 2014-10-17 ENCOUNTER — Emergency Department (HOSPITAL_COMMUNITY)
Admission: EM | Admit: 2014-10-17 | Discharge: 2014-10-17 | Disposition: A | Discharge status: Home / Self Care | Payer: Medicare HMO | Attending: Emergency Medicine | Admitting: Emergency Medicine

## 2014-10-17 ENCOUNTER — Encounter (HOSPITAL_COMMUNITY): Payer: Self-pay | Admitting: Emergency Medicine

## 2014-10-17 DIAGNOSIS — G8929 Other chronic pain: Secondary | ICD-10-CM | POA: Insufficient documentation

## 2014-10-17 DIAGNOSIS — Z86711 Personal history of pulmonary embolism: Secondary | ICD-10-CM | POA: Diagnosis not present

## 2014-10-17 DIAGNOSIS — I10 Essential (primary) hypertension: Secondary | ICD-10-CM | POA: Insufficient documentation

## 2014-10-17 DIAGNOSIS — Z791 Long term (current) use of non-steroidal anti-inflammatories (NSAID): Secondary | ICD-10-CM | POA: Insufficient documentation

## 2014-10-17 DIAGNOSIS — R531 Weakness: Secondary | ICD-10-CM | POA: Diagnosis not present

## 2014-10-17 DIAGNOSIS — Z79899 Other long term (current) drug therapy: Secondary | ICD-10-CM | POA: Insufficient documentation

## 2014-10-17 DIAGNOSIS — R5383 Other fatigue: Secondary | ICD-10-CM | POA: Diagnosis present

## 2014-10-17 LAB — URINALYSIS, ROUTINE W REFLEX MICROSCOPIC
Bilirubin Urine: NEGATIVE
GLUCOSE, UA: NEGATIVE mg/dL
Hgb urine dipstick: NEGATIVE
Ketones, ur: NEGATIVE mg/dL
NITRITE: NEGATIVE
Protein, ur: NEGATIVE mg/dL
Urobilinogen, UA: 0.2 mg/dL (ref 0.0–1.0)
pH: 6 (ref 5.0–8.0)

## 2014-10-17 LAB — COMPREHENSIVE METABOLIC PANEL
ALT: 14 U/L (ref 14–54)
AST: 18 U/L (ref 15–41)
Albumin: 3.9 g/dL (ref 3.5–5.0)
Alkaline Phosphatase: 65 U/L (ref 38–126)
Anion gap: 10 (ref 5–15)
BUN: 17 mg/dL (ref 6–20)
CO2: 25 mmol/L (ref 22–32)
CREATININE: 0.64 mg/dL (ref 0.44–1.00)
Calcium: 8.5 mg/dL — ABNORMAL LOW (ref 8.9–10.3)
Chloride: 105 mmol/L (ref 101–111)
GFR calc non Af Amer: >60 mL/min (ref >60)
Glucose, Bld: 102 mg/dL — ABNORMAL HIGH (ref 65–99)
POTASSIUM: 4.1 mmol/L (ref 3.5–5.1)
Sodium: 140 mmol/L (ref 135–145)
Total Bilirubin: 1.1 mg/dL (ref 0.3–1.2)
Total Protein: 6.6 g/dL (ref 6.5–8.1)

## 2014-10-17 LAB — CBC WITH DIFFERENTIAL/PLATELET
Basophils Absolute: 0 10*3/uL (ref 0.0–0.1)
Basophils Relative: 0 % (ref 0–1)
Eosinophils Absolute: 0.1 10*3/uL (ref 0.0–0.7)
Eosinophils Relative: 2 % (ref 0–5)
HEMATOCRIT: 42.6 % (ref 36.0–46.0)
HEMOGLOBIN: 14.1 g/dL (ref 12.0–15.0)
LYMPHS PCT: 34 % (ref 12–46)
Lymphs Abs: 1.7 10*3/uL (ref 0.7–4.0)
MCH: 30.2 pg (ref 26.0–34.0)
MCHC: 33.1 g/dL (ref 30.0–36.0)
MCV: 91.2 fL (ref 78.0–100.0)
Monocytes Absolute: 0.5 10*3/uL (ref 0.1–1.0)
Monocytes Relative: 11 % (ref 3–12)
NEUTROS ABS: 2.5 10*3/uL (ref 1.7–7.7)
Neutrophils Relative %: 53 % (ref 43–77)
Platelets: 139 10*3/uL — ABNORMAL LOW (ref 150–400)
RBC: 4.67 MIL/uL (ref 3.87–5.11)
RDW: 15.3 % (ref 11.5–15.5)
WBC: 4.9 10*3/uL (ref 4.0–10.5)

## 2014-10-17 LAB — CBG MONITORING, ED: Glucose-Capillary: 82 mg/dL (ref 65–99)

## 2014-10-17 LAB — TROPONIN I: Troponin I: <0.03 ng/mL (ref <0.031)

## 2014-10-17 LAB — URINE MICROSCOPIC-ADD ON

## 2014-10-17 NOTE — ED Notes (Signed)
Pt c/o of fatigue and "not feeling well" since this 0600 this morning. Pt denies any pain. Denies SOB. Denies CP. Denies N/V/D. NAD noted. VSS.

## 2014-10-17 NOTE — ED Notes (Signed)
MD Zammit at bedside. 

## 2014-10-17 NOTE — Discharge Instructions (Signed)
Follow up with your md this week. °

## 2014-10-17 NOTE — ED Notes (Signed)
Pt sent to MRI. Received phone call from MRI tech that pt was refusing MRI. MD Zammit made aware.

## 2014-10-17 NOTE — ED Notes (Signed)
Pt requesting to leave. MD Zammit made aware and is at bedside with pt and family.

## 2014-10-17 NOTE — ED Provider Notes (Signed)
CSN: [id number]     Arrival date & time 10/17/14  [id number] History   First MD Initiated Contact with Patient 10/17/14 [phone]     Chief Complaint  Patient presents with  . Fatigue     (Consider location/radiation/quality/duration/timing/severity/associated sxs/prior Treatment) Patient is a 81 y.o. female presenting with weakness. The history is provided by the patient (the pt states she felt weak today,  generally ,  no focal weakness).  Weakness This is a new problem. The current episode started 1 to 2 hours ago. The problem occurs constantly. The problem has been gradually improving. Pertinent negatives include no chest pain, no abdominal pain, no headaches and no shortness of breath. Nothing aggravates the symptoms. Nothing relieves the symptoms.    Past Medical History  Diagnosis Date  . Hypertension   . Hypoglycemia   . Chronic headache   . Sinusitis   . Pulmonary embolism    Past Surgical History  Procedure Laterality Date  . Appendectomy     No family history on file. History  Substance Use Topics  . Smoking status: Never Smoker   . Smokeless tobacco: Not on file  . Alcohol Use: No   OB History    No data available     Review of Systems  Constitutional: Negative for appetite change and fatigue.  HENT: Negative for congestion, ear discharge and sinus pressure.   Eyes: Negative for discharge.  Respiratory: Negative for cough and shortness of breath.   Cardiovascular: Negative for chest pain.  Gastrointestinal: Negative for abdominal pain and diarrhea.  Genitourinary: Negative for frequency and hematuria.  Musculoskeletal: Negative for back pain.  Skin: Negative for rash.  Neurological: Positive for weakness. Negative for seizures and headaches.  Psychiatric/Behavioral: Negative for hallucinations.      Allergies  Codeine  Home Medications   Prior to Admission medications   Medication Sig Start Date End Date Taking? Authorizing Provider  amLODipine (NORVASC)  5 MG tablet Take 5 mg by mouth every evening.    Yes Historical Provider, MD  ibuprofen (ADVIL,MOTRIN) 200 MG tablet Take 200 mg by mouth every 6 (six) hours as needed for mild pain or moderate pain.   Yes Historical Provider, MD  LORazepam (ATIVAN) 0.5 MG tablet Take 1 tablet by mouth every 6 (six) hours as needed. For anxiety/sleep 11/02/13  Yes Historical Provider, MD  losartan (COZAAR) 50 MG tablet Take 50 mg by mouth every evening.    Yes Historical Provider, MD  rivaroxaban (XARELTO) 20 MG TABS tablet Take 20 mg by mouth at bedtime.   Yes Historical Provider, MD   BP 135/84 mmHg  Pulse 81  Temp(Src) 98.1 F (36.7 C) (Oral)  Resp 16  Ht 5\' 2"  (1.575 m)  Wt 155 lb (70.308 kg)  BMI 28.34 kg/m2  SpO2 97% Physical Exam  Constitutional: She is oriented to person, place, and time. She appears well-developed.  HENT:  Head: Normocephalic.  Eyes: Conjunctivae and EOM are normal. No scleral icterus.  Neck: Neck supple. No thyromegaly present.  Cardiovascular: Normal rate and regular rhythm.  Exam reveals no gallop and no friction rub.   No murmur heard. Pulmonary/Chest: No stridor. She has no wheezes. She has no rales. She exhibits no tenderness.  Abdominal: She exhibits no distension. There is no tenderness. There is no rebound.  Musculoskeletal: Normal range of motion. She exhibits no edema.  Pt ambulated without problems  Lymphadenopathy:    She has no cervical adenopathy.  Neurological: She is oriented to person, place, and  time. She exhibits normal muscle tone. Coordination normal.  Skin: No rash noted. No erythema.  Psychiatric: She has a normal mood and affect. Her behavior is normal.    ED Course  Procedures (including critical care time) Labs Review Labs Reviewed  CBC WITH DIFFERENTIAL/PLATELET - Abnormal; Notable for the following:    Platelets 139 (*)    All other components within normal limits  COMPREHENSIVE METABOLIC PANEL - Abnormal; Notable for the following:     Glucose, Bld 102 (*)    Calcium 8.5 (*)    All other components within normal limits  URINALYSIS, ROUTINE W REFLEX MICROSCOPIC (NOT AT [facility]) - Abnormal; Notable for the following:    Specific Gravity, Urine <1.005 (*)    Leukocytes, UA TRACE (*)    All other components within normal limits  URINE MICROSCOPIC-ADD ON - Abnormal; Notable for the following:    Bacteria, UA FEW (*)    All other components within normal limits  TROPONIN I  CBG MONITORING, ED    Imaging Review Ct Head Wo Contrast  10/17/2014   CLINICAL DATA:  81 year old female not feeling well. Hypertension. Chronic headaches. Subsequent encounter.  EXAM: CT HEAD WITHOUT CONTRAST  TECHNIQUE: Contiguous axial images were obtained from the base of the skull through the vertex without intravenous contrast.  COMPARISON:  11/25/2013.  FINDINGS: No intracranial hemorrhage.  Prominent small vessel disease type changes without CT evidence of large acute infarct.  Global age-appropriate atrophy without hydrocephalus.  Partially empty sella.  Vascular calcifications.  No intracranial mass lesion noted on this unenhanced exam.  No calvarial abnormality.  IMPRESSION: Small vessel disease type changes without CT evidence of large acute infarct.  No intracranial hemorrhage.   Electronically Signed   By: Lacy Duverney M.D.   On: 10/17/2014 10:59     EKG Interpretation None     Pt refused mri MDM   Final diagnoses:  Weakness   Pt had weakness earlier which has resolved.  Pt refused mri,  Nl troponin and vitals with pt back to normal.  Unlikely cardiac, possible mild cva,  Pt will contiune aspirin and follow up with pcp.      Bethann Berkshire, MD 10/17/14 [phone]

## 2014-12-04 ENCOUNTER — Encounter (HOSPITAL_COMMUNITY): Payer: Self-pay

## 2014-12-04 ENCOUNTER — Emergency Department (HOSPITAL_COMMUNITY): Payer: Medicare HMO

## 2014-12-04 ENCOUNTER — Emergency Department (HOSPITAL_COMMUNITY)
Admission: EM | Admit: 2014-12-04 | Discharge: 2014-12-04 | Disposition: A | Discharge status: Home / Self Care | Payer: Medicare HMO | Attending: Emergency Medicine | Admitting: Emergency Medicine

## 2014-12-04 DIAGNOSIS — Z8639 Personal history of other endocrine, nutritional and metabolic disease: Secondary | ICD-10-CM | POA: Diagnosis not present

## 2014-12-04 DIAGNOSIS — M25512 Pain in left shoulder: Secondary | ICD-10-CM | POA: Insufficient documentation

## 2014-12-04 DIAGNOSIS — I10 Essential (primary) hypertension: Secondary | ICD-10-CM | POA: Diagnosis not present

## 2014-12-04 DIAGNOSIS — Z86711 Personal history of pulmonary embolism: Secondary | ICD-10-CM | POA: Diagnosis not present

## 2014-12-04 DIAGNOSIS — Z8709 Personal history of other diseases of the respiratory system: Secondary | ICD-10-CM | POA: Insufficient documentation

## 2014-12-04 DIAGNOSIS — M25511 Pain in right shoulder: Secondary | ICD-10-CM | POA: Diagnosis present

## 2014-12-04 DIAGNOSIS — M542 Cervicalgia: Secondary | ICD-10-CM | POA: Diagnosis not present

## 2014-12-04 DIAGNOSIS — Z79899 Other long term (current) drug therapy: Secondary | ICD-10-CM | POA: Insufficient documentation

## 2014-12-04 MED ORDER — PREDNISONE 10 MG PO TABS: 20.0000 mg | ORAL_TABLET | Freq: Two times a day (BID) | ORAL | Status: DC

## 2014-12-04 MED ORDER — TRAMADOL HCL 50 MG PO TABS: 50.0000 mg | ORAL_TABLET | Freq: Four times a day (QID) | ORAL | Status: DC | PRN

## 2014-12-04 NOTE — Discharge Instructions (Signed)
Prednisone as prescribed.  Tramadol as prescribed as needed for pain.  Follow-up with your primary Dr. if not improving in the next week, and return to the ER symptoms significantly worsen or change.   Musculoskeletal Pain Musculoskeletal pain is muscle and boney aches and pains. These pains can occur in any part of the body. Your caregiver may treat you without knowing the cause of the pain. They may treat you if blood or urine tests, X-rays, and other tests were normal.  CAUSES There is often not a definite cause or reason for these pains. These pains may be caused by a type of germ (virus). The discomfort may also come from overuse. Overuse includes working out too hard when your body is not fit. Boney aches also come from weather changes. Bone is sensitive to atmospheric pressure changes. HOME CARE INSTRUCTIONS   Ask when your test results will be ready. Make sure you get your test results.  Only take over-the-counter or prescription medicines for pain, discomfort, or fever as directed by your caregiver. If you were given medications for your condition, do not drive, operate machinery or power tools, or sign legal documents for 24 hours. Do not drink alcohol. Do not take sleeping pills or other medications that may interfere with treatment.  Continue all activities unless the activities cause more pain. When the pain lessens, slowly resume normal activities. Gradually increase the intensity and duration of the activities or exercise.  During periods of severe pain, bed rest may be helpful. Lay or sit in any position that is comfortable.  Putting ice on the injured area.  Put ice in a bag.  Place a towel between your skin and the bag.  Leave the ice on for 15 to 20 minutes, 3 to 4 times a day.  Follow up with your caregiver for continued problems and no reason can be found for the pain. If the pain becomes worse or does not go away, it may be necessary to repeat tests or do additional  testing. Your caregiver may need to look further for a possible cause. SEEK IMMEDIATE MEDICAL CARE IF:  You have pain that is getting worse and is not relieved by medications.  You develop chest pain that is associated with shortness or breath, sweating, feeling sick to your stomach (nauseous), or throw up (vomit).  Your pain becomes localized to the abdomen.  You develop any new symptoms that seem different or that concern you. MAKE SURE YOU:   Understand these instructions.  Will watch your condition.  Will get help right away if you are not doing well or get worse. Document Released: 03/24/2005 Document Revised: 06/16/2011 Document Reviewed: 11/26/2012 [facility] Patient Information 2015 [place], [place]. This information is not intended to replace advice given to you by your health care provider. Make sure you discuss any questions you have with your health care provider.

## 2014-12-04 NOTE — ED Provider Notes (Signed)
CSN: [id number]     Arrival date & time 12/04/14  4098 History   First MD Initiated Contact with Patient 12/04/14 0906    This chart was scribed for Janet Lyons, MD by Andrew Au, ED Scribe. This patient was seen in room APA06/[facility]06 and the patient's care was started at 9:06 AM. Chief Complaint  Patient presents with  . Shoulder Pain   Patient is a 81 y.o. female presenting with shoulder pain. The history is provided by the patient. No language interpreter was used.  Shoulder Pain Location:  Shoulder Injury: no   Shoulder location:  L shoulder and R shoulder Pain details:    Quality:  Aching and burning   Severity:  Mild   Onset quality:  Gradual   Timing:  Intermittent   Progression:  Worsening Chronicity:  New Prior injury to area:  No Relieved by:  Nothing Worsened by:  Nothing tried Ineffective treatments:  NSAIDs   HPI Comments:  [name] is a 81 y.o. female who present to the Emergency Department complaining of bilateral, aching, burning shoulder pain for several weeks.  Pt states she's had intermittent shoulder pain for several weeks, initally in left shoulder,that has gradually worsened to constant bilateral shoulder pain . Pt denies injury and fall. She's tried ibuprofen without relief to pain. Pt denies numbness, weakness, trouble with gait, bowel and bladder incontience.    Past Medical History  Diagnosis Date  . Hypertension   . Hypoglycemia   . Chronic headache   . Sinusitis   . Pulmonary embolism    Past Surgical History  Procedure Laterality Date  . Appendectomy     History reviewed. No pertinent family history. Social History  Substance Use Topics  . Smoking status: Never Smoker   . Smokeless tobacco: None  . Alcohol Use: No   OB History    No data available     Review of Systems  Musculoskeletal: Positive for myalgias and arthralgias.  All other systems reviewed and are negative.   Allergies  Codeine  Home Medications   Prior to  Admission medications   Medication Sig Start Date End Date Taking? Authorizing Provider  amLODipine (NORVASC) 5 MG tablet Take 5 mg by mouth every evening.     Historical Provider, MD  ibuprofen (ADVIL,MOTRIN) 200 MG tablet Take 200 mg by mouth every 6 (six) hours as needed for mild pain or moderate pain.    Historical Provider, MD  LORazepam (ATIVAN) 0.5 MG tablet Take 1 tablet by mouth every 6 (six) hours as needed. For anxiety/sleep 11/02/13   Historical Provider, MD  losartan (COZAAR) 50 MG tablet Take 50 mg by mouth every evening.     Historical Provider, MD  rivaroxaban (XARELTO) 20 MG TABS tablet Take 20 mg by mouth at bedtime.    Historical Provider, MD   BP 144/73 mmHg  Pulse 91  Temp(Src) 98.2 F (36.8 C) (Oral)  Resp 18  Ht 5\' 2"  (1.575 m)  Wt 150 lb (68.04 kg)  BMI 27.43 kg/m2  SpO2 94% Physical Exam  Constitutional: She appears well-developed and well-nourished. No distress.  HENT:  Head: Normocephalic and atraumatic.  Eyes: Conjunctivae are normal. Right eye exhibits no discharge. Left eye exhibits no discharge.  Neck:  TTP in the soft tissues of the lower neck and bilateral trapezius muscles. Good ROM without significant discomfort   Pulmonary/Chest: Effort normal. No respiratory distress.  Neurological: She is alert. Coordination normal.  Strength is 5/5 in bue. Sensation is intact  to the entire hand bilaterally. Strong ulnar and radial pulses bilaterally  Skin: She is not diaphoretic.  Psychiatric: She has a normal mood and affect. Her behavior is normal.  Nursing note and vitals reviewed.   ED Course  Procedures  DIAGNOSTIC STUDIES: Oxygen Saturation is 94% on RA, n by my interpretation.    COORDINATION OF CARE: 9:33 AM- Pt advised of plan for treatment and pt agrees.  Labs Review Labs Reviewed - No data to display  Imaging Review No results found.   EKG Interpretation None      MDM   Final diagnoses:  None    Patient is a 81 year old female  who presents with complaints of pain in her neck and trapezius muscles. This has begun in the absence of any injury or trauma. X-rays of her neck reveal multilevel degenerative changes and I suspect this is the cause of her discomfort. I will treat her with prednisone, tramadol, and when necessary follow-up with her Dr. if not improving in the next week. The radiology interpretation is recommending an MRI and I feel this can be done on a non-emergent basis if her symptoms do not improve.  I personally performed the services described in this documentation, which was scribed in my presence. The recorded information has been reviewed and is accurate.      Janet Lyons, MD 12/04/14 1027

## 2014-12-04 NOTE — ED Notes (Signed)
Pt presents to ED c/o bilateral shoulder pain "for a while now" with no injury; pain worsens with movement.

## 2015-02-12 ENCOUNTER — Encounter (HOSPITAL_COMMUNITY): Payer: Self-pay | Admitting: Emergency Medicine

## 2015-02-12 ENCOUNTER — Emergency Department (HOSPITAL_COMMUNITY)
Admission: EM | Admit: 2015-02-12 | Discharge: 2015-02-12 | Discharge status: Against Medical Advice | Payer: Medicare HMO | Attending: Emergency Medicine | Admitting: Emergency Medicine

## 2015-02-12 DIAGNOSIS — Z8709 Personal history of other diseases of the respiratory system: Secondary | ICD-10-CM | POA: Insufficient documentation

## 2015-02-12 DIAGNOSIS — R531 Weakness: Secondary | ICD-10-CM | POA: Diagnosis not present

## 2015-02-12 DIAGNOSIS — I10 Essential (primary) hypertension: Secondary | ICD-10-CM | POA: Diagnosis not present

## 2015-02-12 DIAGNOSIS — Z86711 Personal history of pulmonary embolism: Secondary | ICD-10-CM | POA: Insufficient documentation

## 2015-02-12 DIAGNOSIS — Z8639 Personal history of other endocrine, nutritional and metabolic disease: Secondary | ICD-10-CM | POA: Insufficient documentation

## 2015-02-12 LAB — CBC WITH DIFFERENTIAL/PLATELET
Basophils Absolute: 0 10*3/uL (ref 0.0–0.1)
Basophils Relative: 0 %
EOS ABS: 0.1 10*3/uL (ref 0.0–0.7)
EOS PCT: 2 %
HCT: 41.9 % (ref 36.0–46.0)
Hemoglobin: 13.9 g/dL (ref 12.0–15.0)
LYMPHS PCT: 42 %
Lymphs Abs: 2 10*3/uL (ref 0.7–4.0)
MCH: 29.9 pg (ref 26.0–34.0)
MCHC: 33.2 g/dL (ref 30.0–36.0)
MCV: 90.1 fL (ref 78.0–100.0)
Monocytes Absolute: 0.4 10*3/uL (ref 0.1–1.0)
Monocytes Relative: 9 %
Neutro Abs: 2.3 10*3/uL (ref 1.7–7.7)
Neutrophils Relative %: 47 %
PLATELETS: 152 10*3/uL (ref 150–400)
RBC: 4.65 MIL/uL (ref 3.87–5.11)
RDW: 15.6 % — ABNORMAL HIGH (ref 11.5–15.5)
WBC: 4.8 10*3/uL (ref 4.0–10.5)

## 2015-02-12 LAB — BASIC METABOLIC PANEL
Anion gap: 6 (ref 5–15)
BUN: 17 mg/dL (ref 6–20)
CO2: 28 mmol/L (ref 22–32)
Calcium: 8.8 mg/dL — ABNORMAL LOW (ref 8.9–10.3)
Chloride: 108 mmol/L (ref 101–111)
Creatinine, Ser: 0.72 mg/dL (ref 0.44–1.00)
GFR calc Af Amer: >60 mL/min (ref >60)
GFR calc non Af Amer: >60 mL/min (ref >60)
Glucose, Bld: 103 mg/dL — ABNORMAL HIGH (ref 65–99)
Potassium: 4 mmol/L (ref 3.5–5.1)
Sodium: 142 mmol/L (ref 135–145)

## 2015-02-12 NOTE — ED Notes (Signed)
Pt stated that she just doesn't feel well, feels weak, denies pain or any other symptoms. Per son pt called office and they didn't take patients in the am - so pt decided she wanted to come to be checked out b

## 2015-02-12 NOTE — ED Notes (Signed)
Pt states she is leaving.  Dr. Deretha Emoryzackowski notified.

## 2015-04-28 ENCOUNTER — Emergency Department (HOSPITAL_COMMUNITY)
Admission: EM | Admit: 2015-04-28 | Discharge: 2015-04-28 | Discharge status: Against Medical Advice | Payer: Medicare HMO | Attending: Emergency Medicine | Admitting: Emergency Medicine

## 2015-04-28 ENCOUNTER — Emergency Department (HOSPITAL_COMMUNITY): Payer: Medicare HMO

## 2015-04-28 ENCOUNTER — Encounter (HOSPITAL_COMMUNITY): Payer: Self-pay | Admitting: Emergency Medicine

## 2015-04-28 DIAGNOSIS — M6281 Muscle weakness (generalized): Secondary | ICD-10-CM | POA: Insufficient documentation

## 2015-04-28 DIAGNOSIS — R140 Abdominal distension (gaseous): Secondary | ICD-10-CM | POA: Diagnosis not present

## 2015-04-28 DIAGNOSIS — Z86711 Personal history of pulmonary embolism: Secondary | ICD-10-CM | POA: Insufficient documentation

## 2015-04-28 DIAGNOSIS — E162 Hypoglycemia, unspecified: Secondary | ICD-10-CM | POA: Diagnosis not present

## 2015-04-28 DIAGNOSIS — I10 Essential (primary) hypertension: Secondary | ICD-10-CM | POA: Insufficient documentation

## 2015-04-28 DIAGNOSIS — G8929 Other chronic pain: Secondary | ICD-10-CM | POA: Diagnosis not present

## 2015-04-28 DIAGNOSIS — R1084 Generalized abdominal pain: Secondary | ICD-10-CM | POA: Diagnosis not present

## 2015-04-28 DIAGNOSIS — Z8709 Personal history of other diseases of the respiratory system: Secondary | ICD-10-CM | POA: Insufficient documentation

## 2015-04-28 DIAGNOSIS — Z7901 Long term (current) use of anticoagulants: Secondary | ICD-10-CM | POA: Insufficient documentation

## 2015-04-28 DIAGNOSIS — R531 Weakness: Secondary | ICD-10-CM

## 2015-04-28 LAB — COMPREHENSIVE METABOLIC PANEL
ALK PHOS: 59 U/L (ref 38–126)
ALT: 11 U/L — AB (ref 14–54)
AST: 17 U/L (ref 15–41)
Albumin: 3.8 g/dL (ref 3.5–5.0)
Anion gap: 6 (ref 5–15)
BILIRUBIN TOTAL: 1 mg/dL (ref 0.3–1.2)
BUN: 22 mg/dL — AB (ref 6–20)
CALCIUM: 8.7 mg/dL — AB (ref 8.9–10.3)
CO2: 27 mmol/L (ref 22–32)
CREATININE: 0.7 mg/dL (ref 0.44–1.00)
Chloride: 107 mmol/L (ref 101–111)
Glucose, Bld: 101 mg/dL — ABNORMAL HIGH (ref 65–99)
Potassium: 4.3 mmol/L (ref 3.5–5.1)
Sodium: 140 mmol/L (ref 135–145)
Total Protein: 6.4 g/dL — ABNORMAL LOW (ref 6.5–8.1)

## 2015-04-28 LAB — URINALYSIS, ROUTINE W REFLEX MICROSCOPIC
Bilirubin Urine: NEGATIVE
GLUCOSE, UA: NEGATIVE mg/dL
Hgb urine dipstick: NEGATIVE
KETONES UR: NEGATIVE mg/dL
Leukocytes, UA: NEGATIVE
Nitrite: NEGATIVE
PH: 7 (ref 5.0–8.0)
Protein, ur: NEGATIVE mg/dL
Specific Gravity, Urine: 1.01 (ref 1.005–1.030)

## 2015-04-28 LAB — CBC WITH DIFFERENTIAL/PLATELET
Basophils Absolute: 0 10*3/uL (ref 0.0–0.1)
Basophils Relative: 0 %
EOS ABS: 0.1 10*3/uL (ref 0.0–0.7)
Eosinophils Relative: 3 %
HCT: 41.6 % (ref 36.0–46.0)
Hemoglobin: 13.7 g/dL (ref 12.0–15.0)
Lymphocytes Relative: 37 %
Lymphs Abs: 1.6 10*3/uL (ref 0.7–4.0)
MCH: 29.7 pg (ref 26.0–34.0)
MCHC: 32.9 g/dL (ref 30.0–36.0)
MCV: 90 fL (ref 78.0–100.0)
MONOS PCT: 9 %
Monocytes Absolute: 0.4 10*3/uL (ref 0.1–1.0)
Neutro Abs: 2.2 10*3/uL (ref 1.7–7.7)
Neutrophils Relative %: 51 %
Platelets: 142 10*3/uL — ABNORMAL LOW (ref 150–400)
RBC: 4.62 MIL/uL (ref 3.87–5.11)
RDW: 15.8 % — AB (ref 11.5–15.5)
WBC: 4.4 10*3/uL (ref 4.0–10.5)

## 2015-04-28 LAB — CBG MONITORING, ED
GLUCOSE-CAPILLARY: 113 mg/dL — AB (ref 65–99)
Glucose-Capillary: 93 mg/dL (ref 65–99)

## 2015-04-28 LAB — TROPONIN I

## 2015-04-28 LAB — PROTIME-INR
INR: 1.7 — ABNORMAL HIGH (ref 0.00–1.49)
Prothrombin Time: 20 seconds — ABNORMAL HIGH (ref 11.6–15.2)

## 2015-04-28 NOTE — ED Provider Notes (Signed)
CSN: [id number]     Arrival date & time 04/28/15  1191 History  By signing my name below, I, Freida Busman, attest that this documentation has been prepared under the direction and in the presence of Glynn Octave, MD . Electronically Signed: Freida Busman, Scribe. 04/28/2015. 10:31 AM.    Chief Complaint  Patient presents with  . Hypoglycemia    The history is provided by the patient. No language interpreter was used.     HPI Comments:  [name] is a 82 y.o. female who presents to the Emergency Department complaining of subjective hypoglycemia since waking this AM. She reports associated generalized weakness. Pt has a history of the same; states she was diagnosed with hypoglycemia years ago but denies h/o DM. She usually takes glucose tablets or eats protein with relief but states that did not help today. She denies nausea, vomiting, diarrhea, constipation, syncope, diaphoresis, CP, SOB, cough, rhinorrhea, sore throat, back pain, and abdominal pain. Pt is currently on xarelto due to PMHx of  PE. She also reports h/o HTN and is complaint with meds.  Son denies confusion.   Past Medical History  Diagnosis Date  . Hypertension   . Hypoglycemia   . Chronic headache   . Sinusitis   . Pulmonary embolism [facility])    Past Surgical History  Procedure Laterality Date  . Appendectomy     History reviewed. No pertinent family history. Social History  Substance Use Topics  . Smoking status: Never Smoker   . Smokeless tobacco: None  . Alcohol Use: No   OB History    Gravida Para Term Preterm AB TAB SAB Ectopic Multiple Living            6     Review of Systems  Constitutional: Negative for appetite change.  HENT: Negative for rhinorrhea and sore throat.   Respiratory: Negative for cough and shortness of breath.   Cardiovascular: Negative for chest pain.  Gastrointestinal: Negative for nausea, vomiting, abdominal pain, diarrhea and constipation.  Endocrine:       + Hypoglycemia   Musculoskeletal: Negative for back pain.  Neurological: Positive for weakness (generalized). Negative for syncope.  All other systems reviewed and are negative.  Allergies  Codeine  Home Medications   Prior to Admission medications   Medication Sig Start Date End Date Taking? Authorizing Provider  amLODipine (NORVASC) 5 MG tablet Take 5 mg by mouth every evening.    Yes Historical Provider, MD  glucose 4 GM chewable tablet Chew 1 tablet by mouth as needed for low blood sugar.   Yes Historical Provider, MD  ibuprofen (ADVIL,MOTRIN) 200 MG tablet Take 200 mg by mouth every 6 (six) hours as needed for mild pain or moderate pain.   Yes Historical Provider, MD  losartan (COZAAR) 50 MG tablet Take 50 mg by mouth every evening.    Yes Historical Provider, MD  rivaroxaban (XARELTO) 20 MG TABS tablet Take 20 mg by mouth at bedtime.   Yes Historical Provider, MD  predniSONE (DELTASONE) 10 MG tablet Take 2 tablets (20 mg total) by mouth 2 (two) times daily. Patient not taking: Reported on 04/28/2015 12/04/14   [name], MD  traMADol (ULTRAM) 50 MG tablet Take 1 tablet (50 mg total) by mouth every 6 (six) hours as needed. Patient not taking: Reported on 04/28/2015 12/04/14   [name], MD   BP 136/70 mmHg  Pulse 62  Temp(Src) 98.5 F (36.9 C) (Oral)  Resp 14  Ht  (1.575  m)  Wt 180 lb (81.647 kg)  BMI 32.91 kg/m2  SpO2 98% Physical Exam  Constitutional: She is oriented to person, place, and time. She appears well-developed and well-nourished. No distress.  HENT:  Head: Normocephalic and atraumatic.  Mouth/Throat: Oropharynx is clear and moist. No oropharyngeal exudate.  Eyes: Conjunctivae and EOM are normal. Pupils are equal, round, and reactive to light.  Neck: Normal range of motion. Neck supple.  No meningismus.  Cardiovascular: Normal rate, regular rhythm, normal heart sounds and intact distal pulses.   No murmur heard. Pulmonary/Chest: Effort normal and breath sounds normal.  No respiratory distress. She exhibits tenderness.  Abdominal: Soft. She exhibits distension. There is tenderness (mild diffuse TTP). There is no rebound and no guarding.  Musculoskeletal: Normal range of motion. She exhibits no edema or tenderness.  Neurological: She is alert and oriented to person, place, and time. No cranial nerve deficit. She exhibits normal muscle tone. Coordination normal.  No ataxia on finger to nose bilaterally. No pronator drift. 5/5 strength throughout. CN 2-12 intact.Equal grip strength. Sensation intact.   Skin: Skin is warm.  Psychiatric: She has a normal mood and affect. Her behavior is normal.  Nursing note and vitals reviewed.   ED Course  Procedures   DIAGNOSTIC STUDIES:  Oxygen Saturation is 97% on RA, normal by my interpretation.    COORDINATION OF CARE:  10:24 AM Will order UA and bloodwork. Discussed treatment plan with pt at bedside and pt agreed to plan.  Labs Review Labs Reviewed  CBC WITH DIFFERENTIAL/PLATELET - Abnormal; Notable for the following:    RDW 15.8 (*)    Platelets 142 (*)    All other components within normal limits  PROTIME-INR - Abnormal; Notable for the following:    Prothrombin Time 20.0 (*)    INR 1.70 (*)    All other components within normal limits  COMPREHENSIVE METABOLIC PANEL - Abnormal; Notable for the following:    Glucose, Bld 101 (*)    BUN 22 (*)    Calcium 8.7 (*)    Total Protein 6.4 (*)    ALT 11 (*)    All other components within normal limits  CBG MONITORING, ED - Abnormal; Notable for the following:    Glucose-Capillary 113 (*)    All other components within normal limits  URINALYSIS, ROUTINE W REFLEX MICROSCOPIC (NOT AT [facility])  TROPONIN I  TROPONIN I  CBG MONITORING, ED    Imaging Review Dg Abd Acute W/chest  04/28/2015  CLINICAL DATA:  Weakness EXAM: DG ABDOMEN ACUTE W/ 1V CHEST COMPARISON:  01/04/2014 FINDINGS: Cardiac shadow is stable. The lungs are well aerated bilaterally. No focal  infiltrate or sizable effusion is seen. Scattered large and small bowel gas is noted. No abnormal mass or abnormal calcifications are seen. No free air is noted. Density is noted within the right abdomen likely related to ingested tablet. Degenerative changes of lumbar spine are seen. No other focal abnormality is noted. IMPRESSION: No acute abnormality in the chest and abdomen. Electronically Signed   By: Alcide Clever M.D.   On: 04/28/2015 11:39   I have personally reviewed and evaluated these images and lab results as part of my medical decision-making.   EKG Interpretation   Date/Time:  Saturday April 28 2015 10:35:35 EST Ventricular Rate:  66 PR Interval:  164 QRS Duration: 95 QT Interval:  360 QTC Calculation: 377 R Axis:   -55 Text Interpretation:  Sinus rhythm Atrial premature complexes Left  anterior fascicular block  Nonspecific T abnormalities, lateral leads No  significant change was found Confirmed by Manus Gunning  MD, Tressy Kunzman [phone]) on  04/28/2015 11:23:42 AM      MDM   Final diagnoses:  Generalized weakness   Patient with generalized weakness and fatigue and concern for hypoglycemia. Denies chest pain or shortness of breath. denies any urinary symptoms or fever. No vomiting. On xarelto, history of PE.  Blood sugar normal arrival 113. Urinalysis is negative.  repeat blood sugar 93. Patient is tolerating by mouth and ambulatory. She is refusing second troponin. She is also refusing CT imaging of her abdomen. She states her abdomen only hurts when it is pushed on. She states she feels fine and wants to go home.  She is alert and oriented 3. She appears to have capacity to make medical decisions. She will leave AGAINST MEDICAL ADVICE. Her son at bedside was also unsuccessful in getting her to change her mind.   I personally performed the services described in this documentation, which was scribed in my presence. The recorded information has been reviewed and is  accurate.    Glynn Octave, MD 04/28/15 1743

## 2015-04-28 NOTE — ED Notes (Signed)
MD at bedside. 

## 2015-04-28 NOTE — ED Notes (Signed)
PT states generalized weakness and general malaise with episode of hypoglycemia this am and took two glucose tablets and spoonful of peanut butter prior to ED arrival. CBG on arrival 113. PT denies any fever or urinary symptoms and just states general malaise on arrival.

## 2015-04-28 NOTE — Discharge Instructions (Signed)
Weakness You are leaving against medical advice. You did not allow a second blood draw to check your heart enzymes or a CT scan of your abdomen. Return to the ED if you wish to be reevaluated. Weakness is a lack of strength. It may be felt all over the body (generalized) or in one specific part of the body (focal). Some causes of weakness can be serious. You may need further medical evaluation, especially if you are elderly or you have a history of immunosuppression (such as chemotherapy or HIV), kidney disease, heart disease, or diabetes. CAUSES  Weakness can be caused by many different things, including:  Infection.  Physical exhaustion.  Internal bleeding or other blood loss that results in a lack of red blood cells (anemia).  Dehydration. This cause is more common in elderly people.  Side effects or electrolyte abnormalities from medicines, such as pain medicines or sedatives.  Emotional distress, anxiety, or depression.  Circulation problems, especially severe peripheral arterial disease.  Heart disease, such as rapid atrial fibrillation, bradycardia, or heart failure.  Nervous system disorders, such as Guillain-Barr syndrome, multiple sclerosis, or stroke. DIAGNOSIS  To find the cause of your weakness, your caregiver will take your history and perform a physical exam. Lab tests or X-rays may also be ordered, if needed. TREATMENT  Treatment of weakness depends on the cause of your symptoms and can vary greatly. HOME CARE INSTRUCTIONS   Rest as needed.  Eat a well-balanced diet.  Try to get some exercise every day.  Only take over-the-counter or prescription medicines as directed by your caregiver. SEEK MEDICAL CARE IF:   Your weakness seems to be getting worse or spreads to other parts of your body.  You develop new aches or pains. SEEK IMMEDIATE MEDICAL CARE IF:   You cannot perform your normal daily activities, such as getting dressed and feeding yourself.  You  cannot walk up and down stairs, or you feel exhausted when you do so.  You have shortness of breath or chest pain.  You have difficulty moving parts of your body.  You have weakness in only one area of the body or on only one side of the body.  You have a fever.  You have trouble speaking or swallowing.  You cannot control your bladder or bowel movements.  You have black or bloody vomit or stools. MAKE SURE YOU:  Understand these instructions.  Will watch your condition.  Will get help right away if you are not doing well or get worse.   This information is not intended to replace advice given to you by your health care provider. Make sure you discuss any questions you have with your health care provider.   Document Released: 03/24/2005 Document Revised: 09/23/2011 Document Reviewed: 05/23/2011 Elsevier Interactive Patient Education [employer]

## 2015-10-02 ENCOUNTER — Emergency Department (HOSPITAL_COMMUNITY): Payer: Medicare HMO

## 2015-10-02 ENCOUNTER — Encounter (HOSPITAL_COMMUNITY): Payer: Self-pay | Admitting: Emergency Medicine

## 2015-10-02 ENCOUNTER — Emergency Department (HOSPITAL_COMMUNITY)
Admission: EM | Admit: 2015-10-02 | Discharge: 2015-10-02 | Disposition: A | Discharge status: Home / Self Care | Payer: Medicare HMO | Attending: Emergency Medicine | Admitting: Emergency Medicine

## 2015-10-02 DIAGNOSIS — M542 Cervicalgia: Secondary | ICD-10-CM | POA: Diagnosis not present

## 2015-10-02 DIAGNOSIS — R5383 Other fatigue: Secondary | ICD-10-CM | POA: Insufficient documentation

## 2015-10-02 DIAGNOSIS — Z791 Long term (current) use of non-steroidal anti-inflammatories (NSAID): Secondary | ICD-10-CM | POA: Diagnosis not present

## 2015-10-02 DIAGNOSIS — I10 Essential (primary) hypertension: Secondary | ICD-10-CM | POA: Insufficient documentation

## 2015-10-02 DIAGNOSIS — R51 Headache: Secondary | ICD-10-CM | POA: Diagnosis not present

## 2015-10-02 DIAGNOSIS — R519 Headache, unspecified: Secondary | ICD-10-CM

## 2015-10-02 DIAGNOSIS — Z79899 Other long term (current) drug therapy: Secondary | ICD-10-CM | POA: Insufficient documentation

## 2015-10-02 LAB — I-STAT CHEM 8, ED
BUN: 19 mg/dL (ref 6–20)
CREATININE: 0.8 mg/dL (ref 0.44–1.00)
Calcium, Ion: 1.19 mmol/L (ref 1.12–1.23)
Chloride: 104 mmol/L (ref 101–111)
GLUCOSE: 99 mg/dL (ref 65–99)
HCT: 42 % (ref 36.0–46.0)
HEMOGLOBIN: 14.3 g/dL (ref 12.0–15.0)
POTASSIUM: 4.2 mmol/L (ref 3.5–5.1)
Sodium: 141 mmol/L (ref 135–145)
TCO2: 26 mmol/L (ref 0–100)

## 2015-10-02 NOTE — ED Provider Notes (Signed)
CSN: [id number]     Arrival date & time 10/02/15  0906 History  By signing my name below, I, Tanda Rockers, attest that this documentation has been prepared under the direction and in the presence of Marily Memos, MD. Electronically Signed: Tanda Rockers, ED Scribe. 10/02/2015. 9:29 AM.   Chief Complaint  Patient presents with  . Headache  . Fatigue   The history is provided by the patient. No language interpreter was used.   HPI Comments: [name] is a 82 y.o. female with PMHx HTN and chronic headache, who presents to the Emergency Department complaining of gradual onset, intermittent, diffuse headache x a couple of months. Pt also complains of intermittent left lateral neck pain and left shoulder pain s/p ground level fall that occurred a few months ago. She believes that the tension in her neck and shoulder are causing her headaches. She mentions feeling fatigued more than normal as well. Pt does not currently have a PCP, prompting her to come to the ED today. She was attempting to schedule an appointment with Dr. Margo Aye a couple of weeks ago but has not heard anything back from the receptionist. Denies rash, chest pain, back pain, abdominal pain, fever, chills, appetite changes, or any other associated symptoms.    Past Medical History  Diagnosis Date  . Hypertension   . Hypoglycemia   . Chronic headache   . Sinusitis   . Pulmonary embolism [facility])    Past Surgical History  Procedure Laterality Date  . Appendectomy     History reviewed. No pertinent family history. Social History  Substance Use Topics  . Smoking status: Never Smoker   . Smokeless tobacco: None  . Alcohol Use: No   OB History    Gravida Para Term Preterm AB TAB SAB Ectopic Multiple Living            6     Review of Systems  Constitutional: Positive for fatigue. Negative for fever, chills and appetite change.  Cardiovascular: Negative for chest pain.  Musculoskeletal: Positive for arthralgias and neck  pain. Negative for back pain.  Skin: Negative for rash.  Neurological: Positive for headaches.  All other systems reviewed and are negative.  Allergies  Codeine  Home Medications   Prior to Admission medications   Medication Sig Start Date End Date Taking? Authorizing Provider  amLODipine (NORVASC) 5 MG tablet Take 5 mg by mouth every evening.    Yes Historical Provider, MD  glucose 4 GM chewable tablet Chew 1 tablet by mouth as needed for low blood sugar.   Yes Historical Provider, MD  ibuprofen (ADVIL,MOTRIN) 200 MG tablet Take 200 mg by mouth every 6 (six) hours as needed for mild pain or moderate pain.   Yes Historical Provider, MD  losartan (COZAAR) 50 MG tablet Take 50 mg by mouth every evening.    Yes Historical Provider, MD  rivaroxaban (XARELTO) 20 MG TABS tablet Take 20 mg by mouth at bedtime.   Yes Historical Provider, MD   BP 150/82 mmHg  Pulse 81  Temp(Src) 97.8 F (36.6 C) (Oral)  Resp 16  Ht  (1.575 m)  Wt 180 lb (81.647 kg)  BMI 32.91 kg/m2  SpO2 96%   Physical Exam  Constitutional: She is oriented to person, place, and time. She appears well-developed and well-nourished. No distress.  HENT:  Head: Normocephalic and atraumatic.  Eyes: Conjunctivae and EOM are normal.  Neck: Neck supple. No tracheal deviation present.  Cardiovascular: Normal rate, regular rhythm  and normal heart sounds.   Pulmonary/Chest: Effort normal and breath sounds normal. No respiratory distress. She has no wheezes. She has no rales.  Abdominal: Soft. There is no tenderness.  Musculoskeletal: She exhibits tenderness.  Tenderness over left deltoid area. Decreased ability to abduct her left arm. Slightly decreased strength in left shoulder.   Neurological: She is alert and oriented to person, place, and time.  Reflex Scores:      Bicep reflexes are 2+ on the right side and 2+ on the left side. CN nerves intact Upper and lower extremity motor strength and sensation intact  Skin: Skin  is warm and dry.  Psychiatric: She has a normal mood and affect. Her behavior is normal.  Nursing note and vitals reviewed.   ED Course  Procedures (including critical care time)  DIAGNOSTIC STUDIES: Oxygen Saturation is 95% on RA, adequate by my interpretation.    COORDINATION OF CARE: 9:25 AM-Discussed treatment plan which includes consult with Dr. Margo AyeHall to see if patient can be seen soon with pt at bedside and pt agreed to plan.   Labs Review Labs Reviewed  I-STAT CHEM 8, ED    Imaging Review Ct Head Wo Contrast  10/02/2015  CLINICAL DATA:  Chronic headache for approximately 1 month. Generalized fatigue EXAM: CT HEAD WITHOUT CONTRAST TECHNIQUE: Contiguous axial images were obtained from the base of the skull through the vertex without intravenous contrast. COMPARISON:  October 17, 2014 FINDINGS: Brain: The ventricles are normal in size and configuration for age. There is no intracranial mass, hemorrhage, extra-axial fluid collection, or midline shift. There is small vessel disease throughout the centra semiovale bilaterally. Elsewhere gray-white compartments appear unremarkable. No acute infarct is evident. Vascular: There are foci of calcification in each distal vertebral artery and cavernous carotid artery regions. Skull: The bony calvarium appears intact. Sinuses/Orbits: Orbits appear symmetric bilaterally. There is a retention cyst in the inferior right maxillary antrum. There is mild mucosal thickening in several ethmoid air cells bilaterally. Frontal sinuses are aplastic. There is rightward deviation of the nasal septum. Other: Mastoid air cells are clear. IMPRESSION: Stable supratentorial small vessel disease. No acute infarct evident. No hemorrhage or mass effect. Retention cyst inferior right maxillary antrum. Rightward deviation nasal septum. Atherosclerotic calcification at several sites. Electronically Signed   By: Bretta BangWilliam  Woodruff III M.D.   On: 10/02/2015 10:50   Dg Shoulder  Left  10/02/2015  CLINICAL DATA:  Left shoulder pain. Ground level fall occurring a few months ago. Headaches due to left neck and shoulder pain. EXAM: LEFT SHOULDER - 2+ VIEW COMPARISON:  Left shoulder radiographs 08/03/2013. FINDINGS: Progressive degenerative changes are noted in the left shoulder. There significant loss of joint space. No acute abnormality is present. The visualized hemi thorax is clear. The clavicle is intact. IMPRESSION: 1. Progressive degenerative changes in the left shoulder. Electronically Signed   By: Marin Robertshristopher  Mattern M.D.   On: 10/02/2015 10:26   I have personally reviewed and evaluated these images and lab results as part of my medical decision-making.   EKG Interpretation None      MDM   Final diagnoses:  Nonintractable headache, unspecified chronicity pattern, unspecified headache type    82 year old female here with multiple chronic complaints but nothing acute. Head CT negative for any obvious mass or other abnormalities. Headache is likely tension in nature. X-ray of her shoulder is negative as well this is likely arthritis versus biceps tendinitis versus rotator cuff abnormality. Labs are okay without any evidence of hypoglycemia or  other joint abnormalities are related to her symptoms. I did discuss with Dr. Scharlene GlossHall's office and they will get her an appointment on July 10 first patient is grateful. No other emergent pathology suspected this time.  New Prescriptions: Discharge Medication List as of 10/02/2015 11:25 AM       I have personally and contemperaneously reviewed labs and imaging and used in my decision making as above.   A medical screening exam was performed and I feel the patient has had an appropriate workup for their chief complaint at this time and likelihood of emergent condition existing is low and thus workup can continue on an outpatient basis.. Their vital signs are stable. They have been counseled on decision, discharge, follow up and  which symptoms necessitate immediate return to the emergency department.  They verbally stated understanding and agreement with plan and discharged in stable condition.   I personally performed the services described in this documentation, which was scribed in my presence. The recorded information has been reviewed and is accurate.  Marily MemosJason Quentyn Kolbeck, MD 10/02/15 [phone]

## 2015-10-02 NOTE — ED Notes (Signed)
Pt states she has had an intermittent nagging headache for over a month.  Also reports generalized fatigue for several weeks.  States she hurt her left shoulder from a fall a while back and seems to have had headaches since then.

## 2015-12-20 ENCOUNTER — Emergency Department (HOSPITAL_COMMUNITY): Payer: Medicare HMO

## 2015-12-20 ENCOUNTER — Encounter (HOSPITAL_COMMUNITY): Payer: Self-pay

## 2015-12-20 ENCOUNTER — Emergency Department (HOSPITAL_COMMUNITY)
Admission: EM | Admit: 2015-12-20 | Discharge: 2015-12-20 | Discharge status: Against Medical Advice | Payer: Medicare HMO | Attending: Emergency Medicine | Admitting: Emergency Medicine

## 2015-12-20 DIAGNOSIS — E512 Wernicke's encephalopathy: Secondary | ICD-10-CM | POA: Insufficient documentation

## 2015-12-20 DIAGNOSIS — Z791 Long term (current) use of non-steroidal anti-inflammatories (NSAID): Secondary | ICD-10-CM | POA: Insufficient documentation

## 2015-12-20 DIAGNOSIS — I10 Essential (primary) hypertension: Secondary | ICD-10-CM | POA: Insufficient documentation

## 2015-12-20 DIAGNOSIS — R42 Dizziness and giddiness: Secondary | ICD-10-CM

## 2015-12-20 DIAGNOSIS — Z79899 Other long term (current) drug therapy: Secondary | ICD-10-CM | POA: Insufficient documentation

## 2015-12-20 DIAGNOSIS — Z5181 Encounter for therapeutic drug level monitoring: Secondary | ICD-10-CM | POA: Diagnosis not present

## 2015-12-20 LAB — BASIC METABOLIC PANEL
Anion gap: 8 (ref 5–15)
BUN: 17 mg/dL (ref 6–20)
CALCIUM: 8.6 mg/dL — AB (ref 8.9–10.3)
CO2: 25 mmol/L (ref 22–32)
CREATININE: 0.68 mg/dL (ref 0.44–1.00)
Chloride: 106 mmol/L (ref 101–111)
GFR calc non Af Amer: >60 mL/min (ref >60)
Glucose, Bld: 102 mg/dL — ABNORMAL HIGH (ref 65–99)
Potassium: 3.9 mmol/L (ref 3.5–5.1)
SODIUM: 139 mmol/L (ref 135–145)

## 2015-12-20 LAB — URINALYSIS, ROUTINE W REFLEX MICROSCOPIC
BILIRUBIN URINE: NEGATIVE
Glucose, UA: NEGATIVE mg/dL
Hgb urine dipstick: NEGATIVE
KETONES UR: NEGATIVE mg/dL
NITRITE: NEGATIVE
PH: 6 (ref 5.0–8.0)
Protein, ur: NEGATIVE mg/dL
Specific Gravity, Urine: 1.01 (ref 1.005–1.030)

## 2015-12-20 LAB — CBC WITH DIFFERENTIAL/PLATELET
BASOS PCT: 0 %
Basophils Absolute: 0 10*3/uL (ref 0.0–0.1)
EOS ABS: 0.1 10*3/uL (ref 0.0–0.7)
EOS PCT: 3 %
HCT: 39.2 % (ref 36.0–46.0)
Hemoglobin: 12.9 g/dL (ref 12.0–15.0)
Lymphocytes Relative: 41 %
Lymphs Abs: 1.9 10*3/uL (ref 0.7–4.0)
MCH: 28.9 pg (ref 26.0–34.0)
MCHC: 32.9 g/dL (ref 30.0–36.0)
MCV: 87.7 fL (ref 78.0–100.0)
MONO ABS: 0.4 10*3/uL (ref 0.1–1.0)
MONOS PCT: 9 %
Neutro Abs: 2.1 10*3/uL (ref 1.7–7.7)
Neutrophils Relative %: 47 %
Platelets: 147 10*3/uL — ABNORMAL LOW (ref 150–400)
RBC: 4.47 MIL/uL (ref 3.87–5.11)
RDW: 16.4 % — AB (ref 11.5–15.5)
WBC: 4.5 10*3/uL (ref 4.0–10.5)

## 2015-12-20 LAB — PROTIME-INR
INR: 1.17
PROTHROMBIN TIME: 15 s (ref 11.4–15.2)

## 2015-12-20 LAB — URINE MICROSCOPIC-ADD ON: RBC / HPF: NONE SEEN RBC/hpf (ref 0–5)

## 2015-12-20 LAB — TROPONIN I: Troponin I: <0.03 ng/mL (ref <0.03)

## 2015-12-20 MED ORDER — SODIUM CHLORIDE 0.9 % IV BOLUS (SEPSIS)
500.0000 mL | Freq: Once | INTRAVENOUS | Status: AC
  Administered 2015-12-20: 500 mL via INTRAVENOUS

## 2015-12-20 MED ORDER — LORAZEPAM 2 MG/ML IJ SOLN
INTRAMUSCULAR | Status: AC
  Filled 2015-12-20: qty 1

## 2015-12-20 MED ORDER — VITAMIN B-1 100 MG PO TABS: 100.0000 mg | ORAL_TABLET | Freq: Every day | ORAL | 0 refills | Status: DC

## 2015-12-20 MED ORDER — THIAMINE HCL 100 MG/ML IJ SOLN
Freq: Once | INTRAVENOUS | Status: AC
  Administered 2015-12-20: 12:00:00 via INTRAVENOUS
  Filled 2015-12-20: qty 2

## 2015-12-20 MED ORDER — FOLIC ACID 1 MG PO TABS
1.0000 mg | ORAL_TABLET | Freq: Once | ORAL | Status: AC
  Administered 2015-12-20: 1 mg via ORAL
  Filled 2015-12-20: qty 1

## 2015-12-20 MED ORDER — THIAMINE HCL 100 MG/ML IJ SOLN
100.0000 mg | Freq: Once | INTRAMUSCULAR | Status: DC
  Filled 2015-12-20: qty 2

## 2015-12-20 MED ORDER — IOPAMIDOL (ISOVUE-370) INJECTION 76%: 75.0000 mL | Freq: Once | INTRAVENOUS | Status: DC | PRN

## 2015-12-20 NOTE — ED Notes (Signed)
Pt refusing CT scan at this time, attempted to talk to pt about importance of CT scan. Pt states "I'm only human, I can't sit here all day". MD Rancour notified of pt refusing scan again.

## 2015-12-20 NOTE — ED Provider Notes (Signed)
[facility]-EMERGENCY DEPT Provider Note   CSN: [id number] Arrival date & time: 12/20/15  4098  By signing my name below, I, Javier Docker, attest that this documentation has been prepared under the direction and in the presence of Sidonie Dickens, MD. Electronically Signed: Javier Docker, ER Scribe. 11/17/2015. 9:53 AM.   History   Chief Complaint Chief Complaint  Patient presents with  . Dizziness    The history is provided by the patient. No language interpreter was used.   HPI Comments: [name] is a 82 y.o. female who presents to the Emergency Department complaining of gradually improving dizziness onset 6am this morning. She states she felt drunk at that time. She is no longer feeling drunk or dizzy. She did not fall. Her sx started when she stood up from sitting. She denies nausea, presyncope, CP, stomach pain, HA, or abdominal pain. She has never had these sx before. She has been eating and drinking normally. She has not eaten breakfast yet today. She denies vomiting diarrhea, blurry vision, nausea, double vision. Her son brought her into the ED today. She does not use a cain or a walker. She is taking xarelto due to a past hx of PE. She last took it last night. She has no past hx of MI. She has swelling in her bilateral extremities, unchanged from baseline.   Past Medical History:  Diagnosis Date  . Chronic headache   . Hypertension   . Hypoglycemia   . Pulmonary embolism (HCC)   . Sinusitis     There are no active problems to display for this patient.   Past Surgical History:  Procedure Laterality Date  . APPENDECTOMY      OB History    Gravida Para Term Preterm AB Living             6   SAB TAB Ectopic Multiple Live Births                   Home Medications    Prior to Admission medications   Medication Sig Start Date End Date Taking? Authorizing Provider  amLODipine (NORVASC) 5 MG tablet Take 5 mg by mouth every evening.    Yes Historical Provider,  MD  glucose 4 GM chewable tablet Chew 1 tablet by mouth as needed for low blood sugar.   Yes Historical Provider, MD  ibuprofen (ADVIL,MOTRIN) 200 MG tablet Take 200 mg by mouth every 6 (six) hours as needed for mild pain or moderate pain.   Yes Historical Provider, MD  losartan (COZAAR) 50 MG tablet Take 50 mg by mouth every evening.    Yes Historical Provider, MD  rivaroxaban (XARELTO) 20 MG TABS tablet Take 20 mg by mouth at bedtime.   Yes Historical Provider, MD    Family History No family history on file.  Social History Social History  Substance Use Topics  . Smoking status: Never Smoker  . Smokeless tobacco: Never Used  . Alcohol use No     Allergies   Codeine   Review of Systems Review of Systems  Constitutional: Negative for chills and fever.  Gastrointestinal: Negative for abdominal pain, nausea and vomiting.  Neurological: Positive for dizziness. Negative for syncope, facial asymmetry, weakness, numbness and headaches.  A complete 10 system review of systems was obtained and all systems are negative except as noted in the HPI and PMH.     Physical Exam Updated Vital Signs BP 151/74   Pulse 77   Temp 98.2  F (36.8 C) (Oral)   Resp 18   Ht 5\' 2"  (1.575 m)   Wt 150 lb (68 kg)   SpO2 96%   BMI 27.44 kg/m   Physical Exam  Constitutional: She is oriented to person, place, and time. She appears well-developed and well-nourished. No distress.  HENT:  Head: Normocephalic and atraumatic.  Mouth/Throat: Oropharynx is clear and moist. No oropharyngeal exudate.  Eyes: Conjunctivae and EOM are normal. Pupils are equal, round, and reactive to light.  Neck: Normal range of motion. Neck supple.  No meningismus.  Cardiovascular: Normal rate, regular rhythm, normal heart sounds and intact distal pulses.   No murmur heard. Pulmonary/Chest: Effort normal and breath sounds normal. No respiratory distress.  Abdominal: Soft. There is no tenderness. There is no rebound and  no guarding.  Musculoskeletal: Normal range of motion. She exhibits no edema or tenderness.  Neurological: She is alert and oriented to person, place, and time. No cranial nerve deficit. She exhibits normal muscle tone. Coordination normal.  No ataxia on finger to nose bilaterally. No pronator drift. 5/5 strength throughout. CN 2-12 intact.Equal grip strength. Sensation intact. No nystagmus. Negative romberg, normal gait.  Skin: Skin is warm.  Psychiatric: She has a normal mood and affect. Her behavior is normal.  Nursing note and vitals reviewed.    ED Treatments / Results  DIAGNOSTIC STUDIES: Oxygen Saturation is 94% on RA, normal by my interpretation.    COORDINATION OF CARE: 9:55 AM Discussed treatment plan with pt at bedside which includes MRI scan and pt agreed to plan.  Labs (all labs ordered are listed, but only abnormal results are displayed) Labs Reviewed  CBC WITH DIFFERENTIAL/PLATELET - Abnormal; Notable for the following:       Result Value   RDW 16.4 (*)    Platelets 147 (*)    All other components within normal limits  BASIC METABOLIC PANEL - Abnormal; Notable for the following:    Glucose, Bld 102 (*)    Calcium 8.6 (*)    All other components within normal limits  URINALYSIS, ROUTINE W REFLEX MICROSCOPIC (NOT [facility]) - Abnormal; Notable for the following:    Leukocytes, UA SMALL (*)    All other components within normal limits  URINE MICROSCOPIC-ADD ON - Abnormal; Notable for the following:    Squamous Epithelial / LPF 0-5 (*)    Bacteria, UA RARE (*)    All other components within normal limits  TROPONIN I  PROTIME-INR    EKG  EKG Interpretation None       Radiology Mr Brain Wo Contrast  Result Date: 12/20/2015 CLINICAL DATA:  Acute presentation with dizziness and confusion. History of chronic headaches. Patient refused scanning beyond diffusion imaging. EXAM: MRI HEAD WITHOUT CONTRAST TECHNIQUE: Multiplanar, multiecho pulse sequences of the brain  and surrounding structures were obtained without intravenous contrast. COMPARISON:  CT 10/02/2015 FINDINGS: Diffusion imaging shows restricted diffusion affecting the mamillary bodies and medial hypothalamus. This suggests Wernicke encephalopathy. Otherwise, the brain shows chronic small-vessel ischemic changes affecting the white matter. No evidence of mass, hydrocephalus or extra-axial collection. Limited information based on the performance only of the diffusion imaging because of patient tolerance. IMPRESSION: Restricted diffusion affecting the mamillary bodies and medial hypothalamus, highly suggestive of acute Wernicke encephalopathy. See above. Electronically Signed   By: Paulina FusiMark  Shogry M.D.   On: 12/20/2015 11:20    Procedures Procedures (including critical care time)  Medications Ordered in ED Medications  thiamine (B-1) injection 100 mg (not administered)  folic acid (FOLVITE) tablet 1 mg (not administered)  sodium chloride 0.9 % bolus 500 mL (not administered)     Initial Impression / Assessment and Plan / ED Course  I have reviewed the triage vital signs and the nursing notes.  Pertinent labs & imaging results that were available during my care of the patient were reviewed by me and considered in my medical decision making (see chart for details).  Clinical Course   Patient from home with episode of dizziness described as "drunk feeling" that came on while she was watching TV. Rapidly improving. No headache, visual change, chest pain or shortness of breath. On xarelto without any history of head trauma.  Labs unremarkable. Orthostatics negative.  MRI shows findings of possible Wernicke's encephalopathy. Discussed with Dr. Gerilyn Pilgrim of neurology. He recommends treating for thiamine deficiency. Patient has had no recent vomiting or diarrhea. She is not an alcoholic and never has been. He does recommend CTA to rule out vertebrobasilar insufficiency as well.  CTA scan was attempted on  multiple times, the patient continues to refuse. States she is tired of being here. Wants to go home. Attempts were made by nursing staff as well as myself to explain rationale need for CT scan to rule out large vessel occlusion which could potentially cause a stroke.  Patient states she does not want to wait anymore and wants to go home. She is refusing CTA. She appears to have capacity to make medical decisions. She also refuses admission for IV thiamine.  She is agreeable to take thiamine at home.  Recommend followup with PCP and return to the ED she wishes to be reevaluated. She is able to ambulate.  Patient requesting to leave against medical advice. She is alert and oriented x3 and appears to have decision making capacity. Denies suicidal or homicidal ideation.  Discussed that admission and further testing is recommended and emergent medical conditions have not been ruled out. Discussed that leaving against medical advice may result in clinical deterioration and possible death.   Final Clinical Impressions(s) / ED Diagnoses   Final diagnoses:  Dizziness  Wernicke encephalopathy    New Prescriptions New Prescriptions   No medications on file    I personally performed the services described in this documentation, which was scribed in my presence. The recorded information has been reviewed and is accurate.        Glynn Octave, MD 12/20/15 [phone]

## 2015-12-20 NOTE — ED Triage Notes (Signed)
Pt reports woke up at 5 am and felt fine.  Reports at 6 am started feeling dizzy.  Denies any other symptoms.  Pt says she feels like she is drunk.  Reports symptoms have improved a little but still dizzy.  Denies any pain, weakness, numbness, difficuty speaking or swallowing.   Denies headache.

## 2015-12-20 NOTE — ED Notes (Signed)
Pt in MRI at this time 

## 2015-12-20 NOTE — ED Notes (Signed)
Pharmacy to mix Thiamine.

## 2015-12-20 NOTE — Discharge Instructions (Signed)
Take the thiamine as prescribed. You refused a CT scan of the blood vessels in your head and neck and could be at risk for having a stroke. Followup with Dr. Gerilyn Pilgrimoonquah. Return to the ED if you wish to be reevaluated.

## 2015-12-20 NOTE — ED Notes (Signed)
Pt ambulated to the bathroom well. No complaints while ambulating.

## 2016-01-04 IMAGING — CR DG CHEST 2V
2 series · 2 of 2 positions shown · non-contrast
Comparison: 06/21/2012

CLINICAL DATA: Fatigue.  Weakness.  Hypertension.

EXAM:
CHEST  2 VIEW

[view not recorded (1 of 2)]
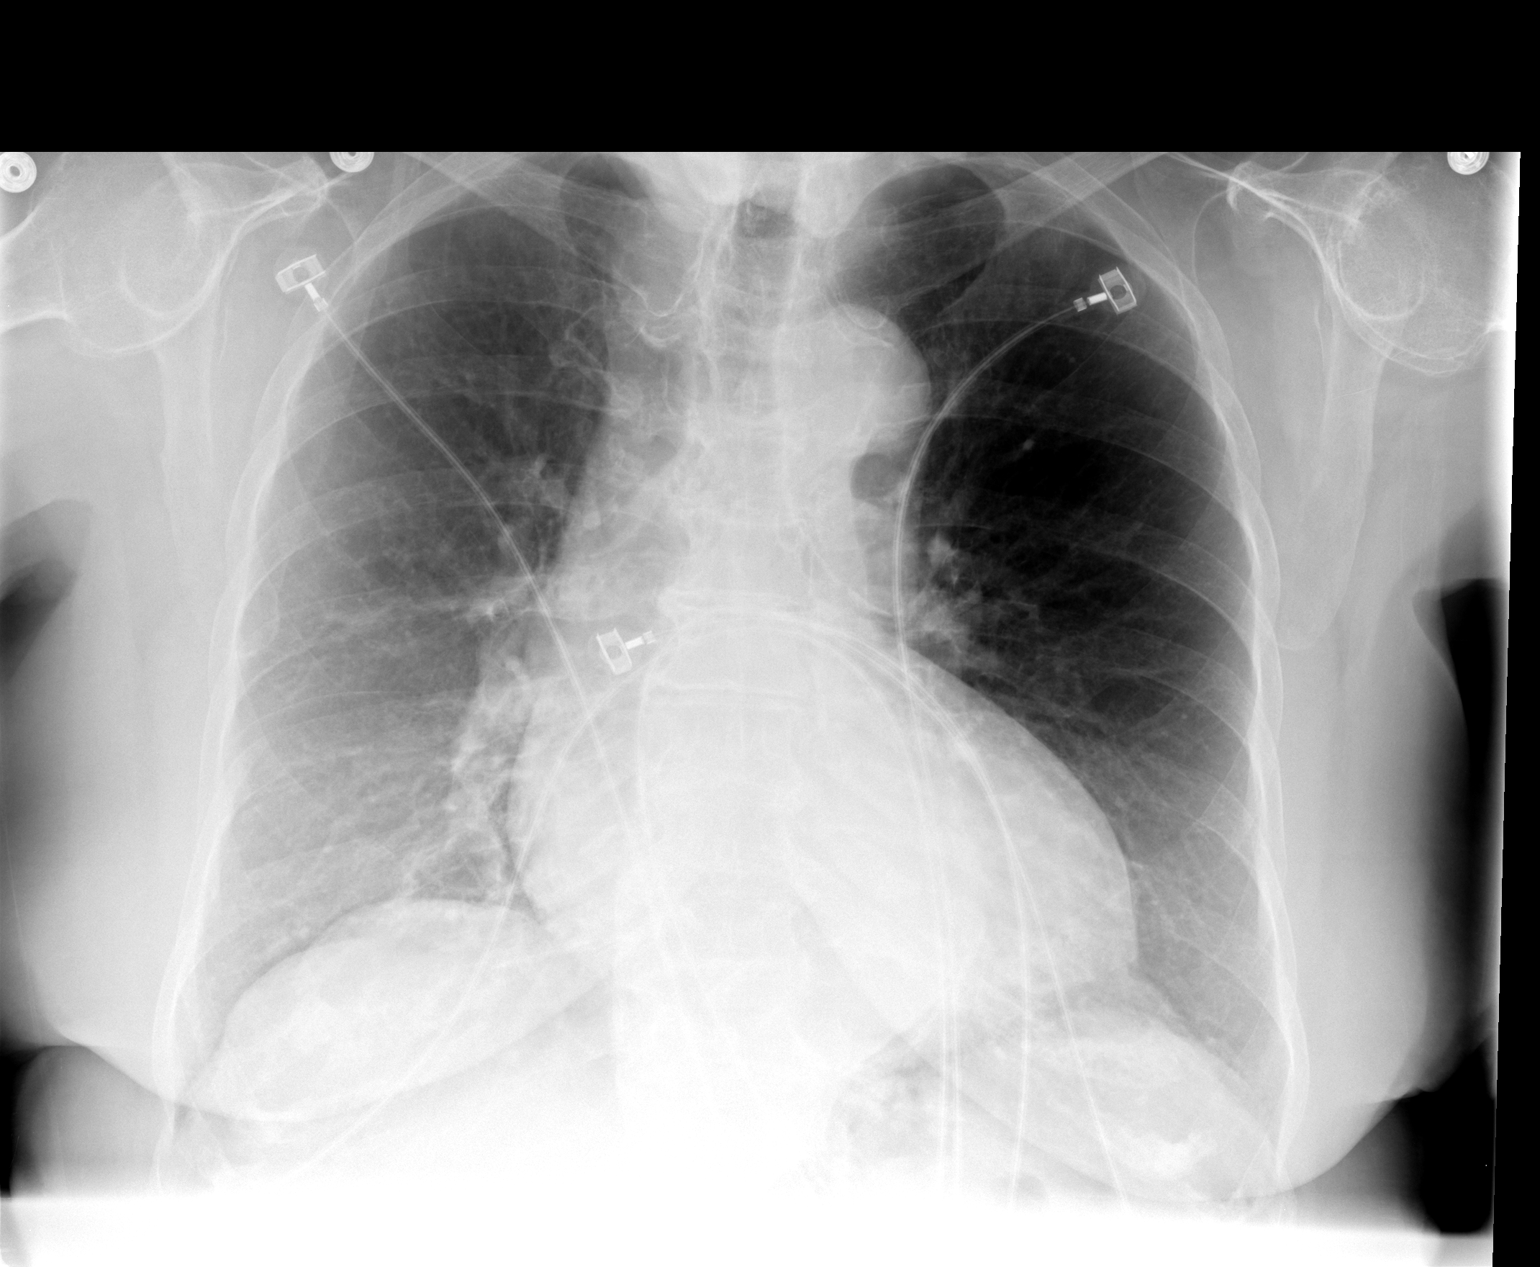

[view not recorded (2 of 2)]
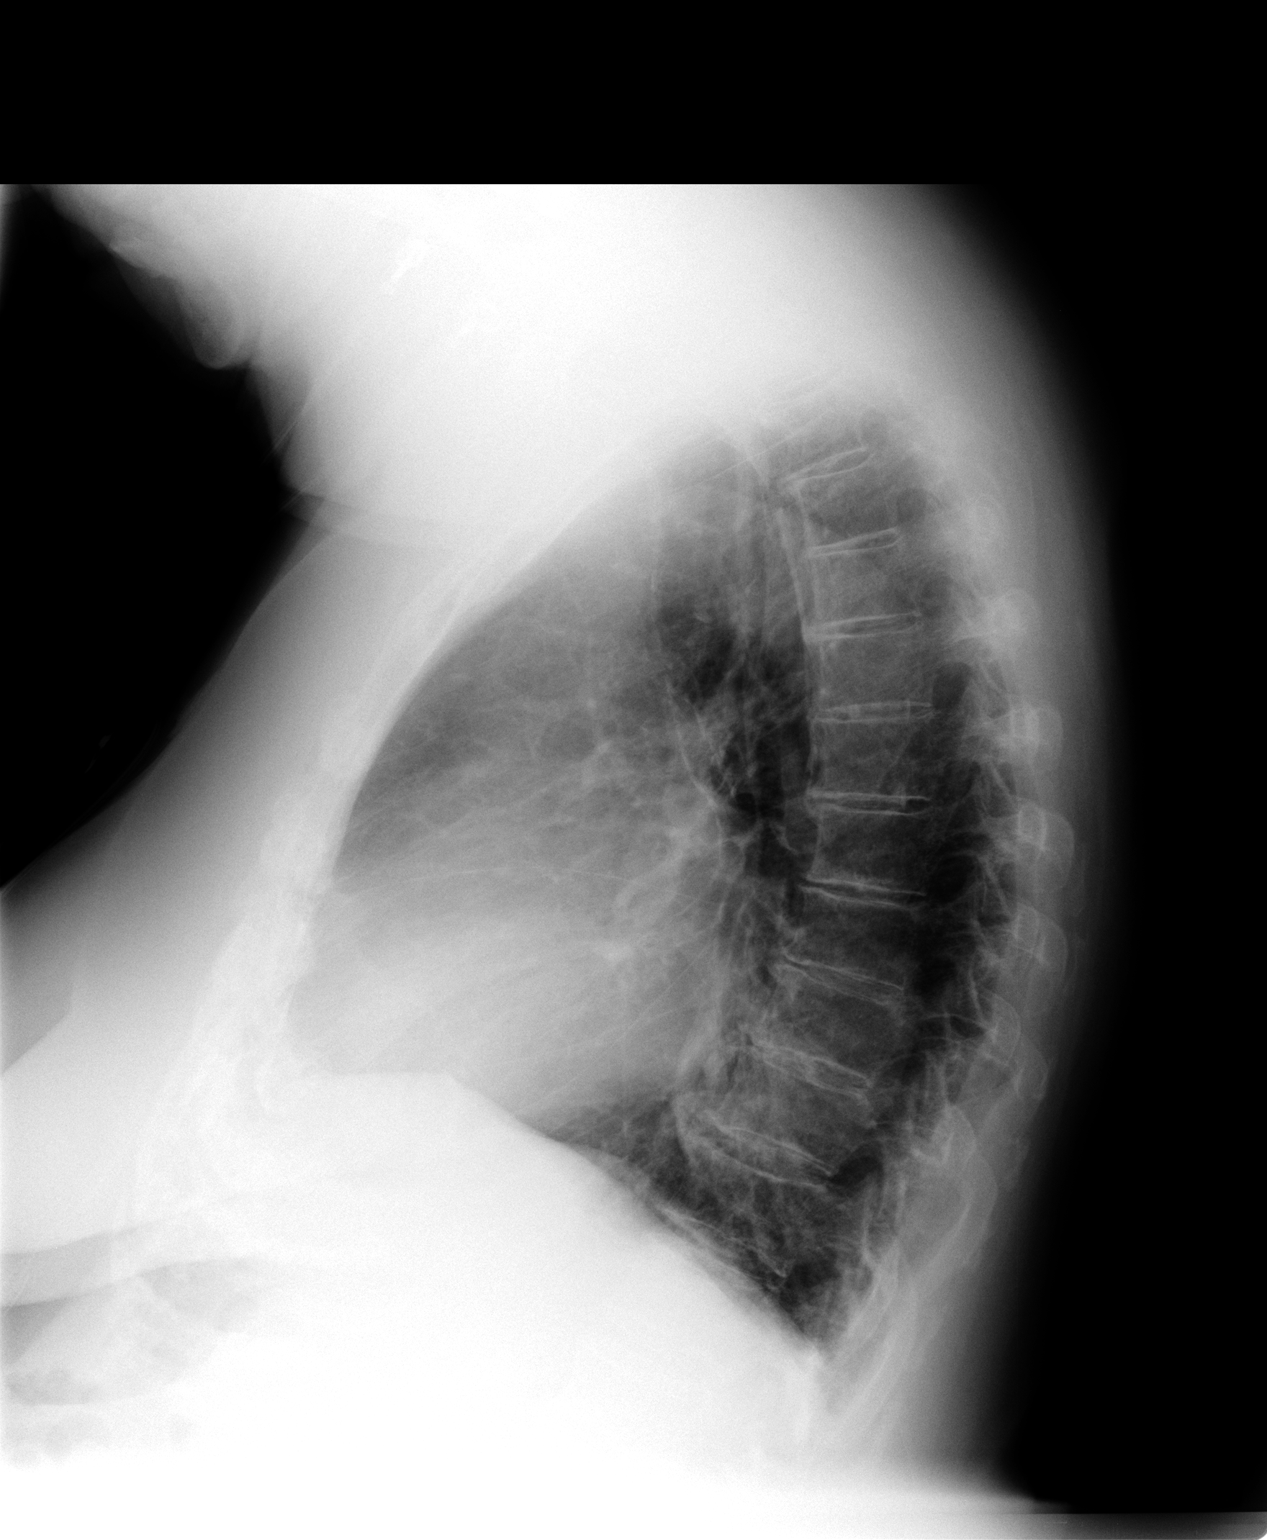

[2 of 2 positions shown; findings below may reference images not displayed]

FINDINGS: Cardiac silhouette is mildly enlarged. Aorta is uncoiled and
tortuous. No mediastinal or hilar masses or convincing adenopathy.

Lungs are clear.  No pleural effusion.  No pneumothorax.

Bony thorax is diffusely demineralized but intact.
IMPRESSION: No acute cardiopulmonary disease.

## 2016-10-20 ENCOUNTER — Emergency Department (HOSPITAL_COMMUNITY)
Admission: EM | Admit: 2016-10-20 | Discharge: 2016-10-20 | Disposition: A | Discharge status: Home / Self Care | Payer: Medicare HMO | Attending: Emergency Medicine | Admitting: Emergency Medicine

## 2016-10-20 ENCOUNTER — Encounter (HOSPITAL_COMMUNITY): Payer: Self-pay

## 2016-10-20 DIAGNOSIS — R5383 Other fatigue: Secondary | ICD-10-CM | POA: Diagnosis present

## 2016-10-20 DIAGNOSIS — I10 Essential (primary) hypertension: Secondary | ICD-10-CM | POA: Insufficient documentation

## 2016-10-20 DIAGNOSIS — Z7901 Long term (current) use of anticoagulants: Secondary | ICD-10-CM | POA: Diagnosis not present

## 2016-10-20 DIAGNOSIS — E162 Hypoglycemia, unspecified: Secondary | ICD-10-CM | POA: Insufficient documentation

## 2016-10-20 LAB — CBC
HEMATOCRIT: 41.9 % (ref 36.0–46.0)
Hemoglobin: 13.5 g/dL (ref 12.0–15.0)
MCH: 29.2 pg (ref 26.0–34.0)
MCHC: 32.2 g/dL (ref 30.0–36.0)
MCV: 90.7 fL (ref 78.0–100.0)
PLATELETS: 156 10*3/uL (ref 150–400)
RBC: 4.62 MIL/uL (ref 3.87–5.11)
RDW: 16.3 % — AB (ref 11.5–15.5)
WBC: 5 10*3/uL (ref 4.0–10.5)

## 2016-10-20 LAB — URINALYSIS, ROUTINE W REFLEX MICROSCOPIC
BILIRUBIN URINE: NEGATIVE
Glucose, UA: NEGATIVE mg/dL
HGB URINE DIPSTICK: NEGATIVE
Ketones, ur: NEGATIVE mg/dL
Nitrite: NEGATIVE
PROTEIN: NEGATIVE mg/dL
SPECIFIC GRAVITY, URINE: 1.016 (ref 1.005–1.030)
pH: 5 (ref 5.0–8.0)

## 2016-10-20 LAB — BASIC METABOLIC PANEL
Anion gap: 8 (ref 5–15)
BUN: 20 mg/dL (ref 6–20)
CHLORIDE: 104 mmol/L (ref 101–111)
CO2: 27 mmol/L (ref 22–32)
CREATININE: 0.75 mg/dL (ref 0.44–1.00)
Calcium: 9.1 mg/dL (ref 8.9–10.3)
GFR calc Af Amer: >60 mL/min (ref >60)
GFR calc non Af Amer: >60 mL/min (ref >60)
Glucose, Bld: 95 mg/dL (ref 65–99)
POTASSIUM: 3.8 mmol/L (ref 3.5–5.1)
Sodium: 139 mmol/L (ref 135–145)

## 2016-10-20 LAB — CBG MONITORING, ED: GLUCOSE-CAPILLARY: 124 mg/dL — AB (ref 65–99)

## 2016-10-20 NOTE — Discharge Instructions (Signed)
The testing today, was reassuring.  We did not find any particular problems with your heart, lungs, blood or urine.  Make sure that you are drinking plenty of fluids, and eating regularly.  Watch for progressive symptoms including fever, weakness, cough or chest pain.  Return here, if needed, for problems.

## 2016-10-20 NOTE — ED Provider Notes (Signed)
[facility]-EMERGENCY DEPT Provider Note   CSN: [id number] Arrival date & time: 10/20/16  1754     History   Chief Complaint Chief Complaint  Patient presents with  . Fatigue    HPI [name] is a 83 y.o. female.  She is here for evaluation of general fatigue from presents for 2 days.  She states that she "just feels weak."  She denies fever, chills, nausea, vomiting, cough, shortness of breath, chest pain, paresthesia or focal weakness.  She is here with a family member.  She continues to be able to walk.  She drove her car twice in the last 2 days, and is concerned that she may have gotten overheated while she did that.  There are no other known modifying factors  HPI  Past Medical History:  Diagnosis Date  . Chronic headache   . Hypertension   . Hypoglycemia   . Pulmonary embolism (HCC)   . Sinusitis     There are no active problems to display for this patient.   Past Surgical History:  Procedure Laterality Date  . APPENDECTOMY      OB History    Gravida Para Term Preterm AB Living             6   SAB TAB Ectopic Multiple Live Births                   Home Medications    Prior to Admission medications   Medication Sig Start Date End Date Taking? Authorizing Provider  amLODipine (NORVASC) 5 MG tablet Take 5 mg by mouth every evening.    Yes [provider]  glucose 4 GM chewable tablet Chew 1 tablet by mouth as needed for low blood sugar.   Yes [provider]  losartan (COZAAR) 50 MG tablet Take 50 mg by mouth every evening.    Yes [provider]  rivaroxaban (XARELTO) 20 MG TABS tablet Take 20 mg by mouth at bedtime.   Yes [provider]    Family History No family history on file.  Social History Social History  Substance Use Topics  . Smoking status: Never Smoker  . Smokeless tobacco: Never Used  . Alcohol use No     Allergies   Codeine   Review of Systems Review of Systems  All other systems  reviewed and are negative.    Physical Exam Updated Vital Signs BP (!) 160/85   Pulse 76   Temp 98 F (36.7 C)   Resp 18   Ht 5\' 2"  (1.575 m)   Wt 68 kg (150 lb)   SpO2 97%   BMI 27.44 kg/m   Physical Exam  Constitutional: She is oriented to person, place, and time. She appears well-developed. No distress.  Elderly, frail  HENT:  Head: Normocephalic and atraumatic.  Eyes: Pupils are equal, round, and reactive to light. Conjunctivae and EOM are normal.  Neck: Normal range of motion and phonation normal. Neck supple.  Cardiovascular: Normal rate and regular rhythm.   Pulmonary/Chest: Effort normal and breath sounds normal. She exhibits no tenderness.  Abdominal: Soft. She exhibits no distension. There is no tenderness. There is no guarding.  Musculoskeletal: Normal range of motion.  Neurological: She is alert and oriented to person, place, and time. No cranial nerve deficit. She exhibits normal muscle tone.  No dysarthria or aphasia  Skin: Skin is warm and dry.  Psychiatric: She has a normal mood and affect. Her behavior is normal. Judgment  and thought content normal.  Nursing note and vitals reviewed.    ED Treatments / Results  Labs (all labs ordered are listed, but only abnormal results are displayed) Labs Reviewed  CBC - Abnormal; Notable for the following:       Result Value   RDW 16.3 (*)    All other components within normal limits  URINALYSIS, ROUTINE W REFLEX MICROSCOPIC - Abnormal; Notable for the following:    Leukocytes, UA SMALL (*)    Bacteria, UA RARE (*)    Squamous Epithelial / LPF 0-5 (*)    All other components within normal limits  CBG MONITORING, ED - Abnormal; Notable for the following:    Glucose-Capillary 124 (*)    All other components within normal limits  BASIC METABOLIC PANEL    EKG  EKG Interpretation  Date/Time:  Monday October 20 2016 18:16:50 EDT Ventricular Rate:  94 PR Interval:  166 QRS Duration: 98 QT Interval:  346 QTC  Calculation: 432 R Axis:   -52 Text Interpretation:  Sinus rhythm with Premature atrial complexes Left axis deviation Incomplete right bundle branch block Abnormal ECG since last tracing no significant change Confirmed by Mancel BaleWentz, Wilhelmena Zea [phone]([id number]) on 10/20/2016 9:17:55 PM       Radiology No results found.  Procedures Procedures (including critical care time)  Medications Ordered in ED Medications - No data to display   Initial Impression / Assessment and Plan / ED Course  I have reviewed the triage vital signs and the nursing notes.  Pertinent labs & imaging results that were available during my care of the patient were reviewed by me and considered in my medical decision making (see chart for details).      Patient Vitals for the past 24 hrs:  BP Temp Temp src Pulse Resp SpO2 Height Weight  10/20/16 2130 (!) 160/85 - - 76 18 97 % - -  10/20/16 2100 (!) 142/82 - - 79 20 99 % - -  10/20/16 2030 (!) 143/107 - - 81 17 96 % - -  10/20/16 1951 (!) 166/74 98 F (36.7 C) - 89 20 96 % - -  10/20/16 1809 116/70 98.5 F (36.9 C) Oral 93 16 98 % 5\' 2"  (1.575 m) 68 kg (150 lb)    At discharge- reevaluation with update and discussion. After initial assessment and treatment, an updated evaluation reveals she remains comfortable and has no further complaints.  Findings discussed with patient and family member, all questions were answered. Lilliann Rossetti L    Final Clinical Impressions(s) / ED Diagnoses   Final diagnoses:  Fatigue, unspecified type   Nonspecific malaise/fatigue.  Doubt serious bacterial infection metabolic instability or impending vascular collapse.  Nursing Notes Reviewed/ Care Coordinated Applicable Imaging Reviewed Interpretation of Laboratory Data incorporated into ED treatment  The patient appears reasonably screened and/or stabilized for discharge and I doubt any other medical condition or other [facility] requiring further screening, evaluation, or treatment in the ED at  this time prior to discharge.  Plan: Home Medications-continue usual; Home Treatments-rest, increase oral intake of fluids and food; return here if the recommended treatment, does not improve the symptoms; Recommended follow up-PCP checkup 1 week   New Prescriptions Discharge Medication List as of 10/20/2016 10:04 PM       Mancel BaleWentz, Katriona Schmierer, MD 10/20/16 [phone]

## 2016-10-20 NOTE — ED Triage Notes (Signed)
Patient reports of generalized weakness x2 days. Son reports patient has been riding in a car with no air several hours total. Patient has no other complaints.

## 2017-03-01 ENCOUNTER — Emergency Department (HOSPITAL_COMMUNITY): Payer: Medicare HMO

## 2017-03-01 ENCOUNTER — Other Ambulatory Visit: Payer: Self-pay

## 2017-03-01 ENCOUNTER — Encounter (HOSPITAL_COMMUNITY): Payer: Self-pay

## 2017-03-01 ENCOUNTER — Emergency Department (HOSPITAL_COMMUNITY)
Admission: EM | Admit: 2017-03-01 | Discharge: 2017-03-01 | Disposition: A | Discharge status: Home / Self Care | Payer: Medicare HMO | Attending: Emergency Medicine | Admitting: Emergency Medicine

## 2017-03-01 DIAGNOSIS — I10 Essential (primary) hypertension: Secondary | ICD-10-CM | POA: Insufficient documentation

## 2017-03-01 DIAGNOSIS — R51 Headache: Secondary | ICD-10-CM | POA: Diagnosis not present

## 2017-03-01 DIAGNOSIS — M542 Cervicalgia: Secondary | ICD-10-CM | POA: Insufficient documentation

## 2017-03-01 DIAGNOSIS — G8929 Other chronic pain: Secondary | ICD-10-CM | POA: Insufficient documentation

## 2017-03-01 DIAGNOSIS — Z79899 Other long term (current) drug therapy: Secondary | ICD-10-CM | POA: Insufficient documentation

## 2017-03-01 DIAGNOSIS — R519 Headache, unspecified: Secondary | ICD-10-CM

## 2017-03-01 HISTORY — DX: Weakness: R53.1

## 2017-03-01 HISTORY — DX: Other cervical disc degeneration, unspecified cervical region: M50.30

## 2017-03-01 LAB — I-STAT CHEM 8, ED
BUN: 21 mg/dL — AB (ref 6–20)
CHLORIDE: 102 mmol/L (ref 101–111)
CREATININE: 0.7 mg/dL (ref 0.44–1.00)
Calcium, Ion: 1.17 mmol/L (ref 1.15–1.40)
Glucose, Bld: 92 mg/dL (ref 65–99)
HEMATOCRIT: 42 % (ref 36.0–46.0)
Hemoglobin: 14.3 g/dL (ref 12.0–15.0)
Potassium: 4.3 mmol/L (ref 3.5–5.1)
Sodium: 142 mmol/L (ref 135–145)
TCO2: 28 mmol/L (ref 22–32)

## 2017-03-01 NOTE — ED Notes (Signed)
Pt alert & oriented x4, stable gait. Patient  given discharge instructions, paperwork & prescription(s). Patient verbalized understanding. Pt left department w/ no further questions. 

## 2017-03-01 NOTE — ED Triage Notes (Signed)
Patient reports of left side neck pain that goes to top of head x 3 weeks . Family reports patient has been lethargic more than usual while sitting down. Patient a&ox4.   Patient was given Robaxin for neck pain by Dr. Margo AyeHall, patient doesn't take it due to medication making patient dizzy and to drowsy.

## 2017-03-01 NOTE — ED Provider Notes (Signed)
[facility]   CSN: [id number] Arrival date & time: 03/01/17  1823     History   Chief Complaint Chief Complaint  Patient presents with  . Neck Pain    HPI [name] is a 83 y.o. female.  She complains of left-sided neck pain and left-sided headache for 1 month, constant, described as a dull ache, unchanged today.  Not made worse or better by anything.  No visual changes no fever no rest pain no nausea or vomiting other associated symptoms.  Her son reports that she falls asleep frequently however she is not sleepy presently.  Symptoms are unchanged today.  She treats herself with ibuprofen with partial relief.  No other associated symptoms.  No chest pain no fever no urinary symptoms.  Presents today to get "checked out" she is been seen by Dr. Scharlene GlossHall's nurse practitioner and wishes a second opinion  HPI  Past Medical History:  Diagnosis Date  . Chronic headache   . DDD (degenerative disc disease), cervical   . Generalized weakness   . Hypertension   . Hypoglycemia   . Pulmonary embolism (HCC)   . Sinusitis   Formerly on Xarelto has been off of Xarelto for several months  There are no active problems to display for this patient.   Past Surgical History:  Procedure Laterality Date  . APPENDECTOMY      OB History    Gravida Para Term Preterm AB Living             6   SAB TAB Ectopic Multiple Live Births                   Home Medications    Prior to Admission medications   Medication Sig Start Date End Date Taking? Authorizing Provider  amLODipine (NORVASC) 5 MG tablet Take 5 mg by mouth every evening.     [provider]  glucose 4 GM chewable tablet Chew 1 tablet by mouth as needed for low blood sugar.    [provider]  losartan (COZAAR) 50 MG tablet Take 50 mg by mouth every evening.     [provider]  rivaroxaban (XARELTO) 20 MG TABS tablet Take 20 mg by mouth at bedtime.    [provider]    Family History No family history on file.  Social History Social History   Tobacco Use  . Smoking status: Never Smoker  . Smokeless tobacco: Never Used  Substance Use Topics  . Alcohol use: No  . Drug use: No     Allergies   Codeine   Review of Systems Review of Systems  Constitutional: Negative.   HENT: Negative.   Respiratory: Negative.   Cardiovascular: Positive for leg swelling.       Chronic leg edema  Gastrointestinal: Negative.   Musculoskeletal: Positive for neck pain.  Skin: Negative.   Neurological: Positive for headaches.  Psychiatric/Behavioral: Negative.   All other systems reviewed and are negative.    Physical Exam Updated Vital Signs BP (!) 200/92 (BP Location: Left Arm)   Pulse 81   Temp 98.5 F (36.9 C) (Oral)   Resp 17   Ht 5\' 2"  (1.575 m)   Wt 68 kg (150 lb)   SpO2 96%   BMI 27.44 kg/m   Physical Exam  Constitutional: She is oriented to person, place, and time. She appears well-developed and well-nourished. No distress.  HENT:  Head: Normocephalic and atraumatic.  Right Ear:  External ear normal.  Left Ear: External ear normal.  No facial asymmetry  Eyes: Conjunctivae are normal. Pupils are equal, round, and reactive to light.  Neck: Normal range of motion. Neck supple. No tracheal deviation present. No thyromegaly present.  No bruit  Cardiovascular: Normal rate and regular rhythm.  No murmur heard. Pulmonary/Chest: Effort normal and breath sounds normal.  Abdominal: Soft. Bowel sounds are normal. She exhibits no distension. There is no tenderness.  Obese  Musculoskeletal: Normal range of motion. She exhibits edema. She exhibits no tenderness.  2+ pretibial pitting edema bilaterally  Neurological: She is alert and oriented to person, place, and time. No cranial nerve deficit. Coordination normal.  Normal Romberg normal pronator drift normal DTR symmetric bilaterally at knee jerk ankle jerk and biceps  Skin:  Skin is warm and dry. No rash noted.  Psychiatric: She has a normal mood and affect.  Nursing note and vitals reviewed.    ED Treatments / Results  Labs (all labs ordered are listed, but only abnormal results are displayed) Labs Reviewed  I-STAT CHEM 8, ED    EKG  EKG Interpretation  Date/Time:  Sunday March 01 2017 21:42:15 EST Ventricular Rate:  82 PR Interval:    QRS Duration: 108 QT Interval:  360 QTC Calculation: 421 R Axis:   -36 Text Interpretation:  Sinus rhythm Incomplete RBBB and LAFB LVH with secondary repolarization abnormality Anterior Q waves, possibly due to LVH No significant change since last tracing Confirmed by Doug SouJacubowitz, Cyan Moultrie [phone]) on 03/01/2017 9:56:38 PM       Radiology No results found.  Procedures Procedures (including critical care time)  Medications Ordered in ED Medications - No data to display Results for orders placed or performed during the Ball encounter of 03/01/17  I-stat chem 8, ed  Result Value Ref Range   Sodium 142 135 - 145 mmol/L   Potassium 4.3 3.5 - 5.1 mmol/L   Chloride 102 101 - 111 mmol/L   BUN 21 (H) 6 - 20 mg/dL   Creatinine, Ser 6.040.70 0.44 - 1.00 mg/dL   Glucose, Bld 92 65 - 99 mg/dL   Calcium, Ion 5.401.17 9.811.15 - 1.40 mmol/L   TCO2 28 22 - 32 mmol/L   Hemoglobin 14.3 12.0 - 15.0 g/dL   HCT 19.142.0 47.836.0 - 29.546.0 %   No results found.  Initial Impression / Assessment and Plan / ED Course  I have reviewed the triage vital signs and the nursing notes.  Pertinent labs & imaging results that were available during my care of the patient were reviewed by me and considered in my medical decision making (see chart for details).     Discussed with patient and son that emergent imaging not needed.  They are in agreement 10:30 PM patient resting comfortably.  No signs of acute coronary syndrome.  No signs of hypertensive encephalopathy all symptoms are chronic.  Plan Tylenol for pain.  Blood pressure recheck 1 week Final  Clinical Impressions(s) / ED Diagnoses  Diagnoses #1 chronic headache #2 chronic neck pain #3 elevated blood pressure Final diagnoses:  None    ED Discharge Orders    None       Doug SouJacubowitz, Roland Prine, MD 03/01/17 2239

## 2017-03-01 NOTE — Discharge Instructions (Signed)
Take Tylenol as directed for pain.  Call Dr. Scharlene GlossHall's office to arrange to be seen in a week to get your blood pressure rechecked.  Tonight's was elevated at thanks thanks thanks for starting

## 2017-08-01 ENCOUNTER — Other Ambulatory Visit: Payer: Self-pay

## 2017-08-01 ENCOUNTER — Emergency Department (HOSPITAL_COMMUNITY)
Admission: EM | Admit: 2017-08-01 | Discharge: 2017-08-01 | Disposition: A | Discharge status: Home / Self Care | Payer: Medicare HMO | Attending: Emergency Medicine | Admitting: Emergency Medicine

## 2017-08-01 ENCOUNTER — Encounter (HOSPITAL_COMMUNITY): Payer: Self-pay | Admitting: Emergency Medicine

## 2017-08-01 DIAGNOSIS — I10 Essential (primary) hypertension: Secondary | ICD-10-CM | POA: Diagnosis not present

## 2017-08-01 DIAGNOSIS — Z79899 Other long term (current) drug therapy: Secondary | ICD-10-CM | POA: Insufficient documentation

## 2017-08-01 DIAGNOSIS — R5381 Other malaise: Secondary | ICD-10-CM | POA: Diagnosis not present

## 2017-08-01 DIAGNOSIS — N390 Urinary tract infection, site not specified: Secondary | ICD-10-CM

## 2017-08-01 DIAGNOSIS — R531 Weakness: Secondary | ICD-10-CM | POA: Diagnosis present

## 2017-08-01 LAB — BASIC METABOLIC PANEL
ANION GAP: 10 (ref 5–15)
BUN: 19 mg/dL (ref 6–20)
CALCIUM: 8.6 mg/dL — AB (ref 8.9–10.3)
CO2: 25 mmol/L (ref 22–32)
Chloride: 102 mmol/L (ref 101–111)
Creatinine, Ser: 0.73 mg/dL (ref 0.44–1.00)
GFR calc non Af Amer: >60 mL/min (ref >60)
Glucose, Bld: 98 mg/dL (ref 65–99)
POTASSIUM: 4 mmol/L (ref 3.5–5.1)
Sodium: 137 mmol/L (ref 135–145)

## 2017-08-01 LAB — CBC
HEMATOCRIT: 43.6 % (ref 36.0–46.0)
HEMOGLOBIN: 14.1 g/dL (ref 12.0–15.0)
MCH: 31 pg (ref 26.0–34.0)
MCHC: 32.3 g/dL (ref 30.0–36.0)
MCV: 95.8 fL (ref 78.0–100.0)
Platelets: 130 10*3/uL — ABNORMAL LOW (ref 150–400)
RBC: 4.55 MIL/uL (ref 3.87–5.11)
RDW: 15.5 % (ref 11.5–15.5)
WBC: 4.4 10*3/uL (ref 4.0–10.5)

## 2017-08-01 LAB — URINALYSIS, ROUTINE W REFLEX MICROSCOPIC
Bilirubin Urine: NEGATIVE
GLUCOSE, UA: NEGATIVE mg/dL
Hgb urine dipstick: NEGATIVE
KETONES UR: NEGATIVE mg/dL
Nitrite: NEGATIVE
PH: 7 (ref 5.0–8.0)
Protein, ur: NEGATIVE mg/dL
Specific Gravity, Urine: 1.011 (ref 1.005–1.030)

## 2017-08-01 LAB — TROPONIN I: Troponin I: <0.03 ng/mL (ref <0.03)

## 2017-08-01 LAB — CBG MONITORING, ED: Glucose-Capillary: 96 mg/dL (ref 65–99)

## 2017-08-01 MED ORDER — CEPHALEXIN 250 MG PO CAPS: 250.0000 mg | ORAL_CAPSULE | Freq: Four times a day (QID) | ORAL | 0 refills | Status: DC

## 2017-08-01 MED ORDER — CEPHALEXIN 250 MG PO CAPS
250.0000 mg | ORAL_CAPSULE | Freq: Once | ORAL | Status: AC
  Administered 2017-08-01: 250 mg via ORAL
  Filled 2017-08-01 (×2): qty 1

## 2017-08-01 NOTE — Discharge Instructions (Addendum)
The testing indicates that you might have a urinary tract infection.  We are treating you with an antibiotic, and performing a urine culture, to check for the presence of infection.  Start taking the antibiotic prescription tomorrow.  Make sure that you are getting plenty of rest, drink a lot of fluids, and follow-up with your PCP for checkup in 1 week.

## 2017-08-01 NOTE — ED Triage Notes (Signed)
Patient reports she "feels bad, just worn out." No c/o pain. Family reports patient's blood pressure medicine was increased a couple of weeks ago. Onset yesterday.

## 2017-08-01 NOTE — ED Provider Notes (Signed)
[facility] EMERGENCY DEPARTMENT Provider Note   CSN: [id number] Arrival date & time: 08/01/17  1732     History   Chief Complaint Chief Complaint  Patient presents with  . Weakness    HPI [name] is a 84 y.o. female.  Patient here by private vehicle for evaluation of a weak feeling, which she describes as "feeling bad all over."  She denies headache, chest pain, back pain or abdominal pain.  She denies dysuria, urinary frequency, patient.  No prior similar problems.  There are no other known modifying factors.  HPI  Past Medical History:  Diagnosis Date  . Chronic headache   . DDD (degenerative disc disease), cervical   . Generalized weakness   . Hypertension   . Hypoglycemia   . Pulmonary embolism (HCC)   . Sinusitis     There are no active problems to display for this patient.   Past Surgical History:  Procedure Laterality Date  . APPENDECTOMY       OB History    Gravida      Para      Term      Preterm      AB      Living  6     SAB      TAB      Ectopic      Multiple      Live Births               Home Medications    Prior to Admission medications   Medication Sig Start Date End Date Taking? Authorizing Provider  furosemide (LASIX) 20 MG tablet Take 20 mg by mouth daily as needed. 07/21/17  Yes [provider]  glucose 4 GM chewable tablet Chew 1 tablet by mouth as needed for low blood sugar.   Yes [provider]  losartan (COZAAR) 100 MG tablet Take 100 mg by mouth every evening.    Yes [provider]  potassium chloride (K-DUR) 10 MEQ tablet Take 10 mEq by mouth daily as needed. 07/21/17  Yes [provider]  cephALEXin (KEFLEX) 250 MG capsule Take 1 capsule (250 mg total) by mouth 4 (four) times daily. 08/01/17   [name], MD    Family History History reviewed. No pertinent family history.  Social History Social History   Tobacco Use  . Smoking status: Never Smoker  .  Smokeless tobacco: Never Used  Substance Use Topics  . Alcohol use: No  . Drug use: No     Allergies   Codeine   Review of Systems Review of Systems  All other systems reviewed and are negative.    Physical Exam Updated Vital Signs BP (!) 187/85 (BP Location: Right Arm)   Pulse 76   Temp 98.4 F (36.9 C) (Oral)   Resp 18   Ht  (1.575 m)   Wt 70.3 kg (155 lb)   SpO2 97%   BMI 28.35 kg/m   Physical Exam  Constitutional: She is oriented to person, place, and time. She appears well-developed and well-nourished.  HENT:  Head: Normocephalic and atraumatic.  Right Ear: External ear normal.  Left Ear: External ear normal.  Eyes: Pupils are equal, round, and reactive to light. Conjunctivae and EOM are normal.  Neck: Normal range of motion and phonation normal. Neck supple.  Cardiovascular: Normal rate, regular rhythm and normal heart sounds.  Pulmonary/Chest: Effort normal and breath sounds normal. She exhibits no bony tenderness.  Abdominal: Soft. There  is no tenderness.  Musculoskeletal: Normal range of motion.  Neurological: She is alert and oriented to person, place, and time. No cranial nerve deficit or sensory deficit. She exhibits normal muscle tone. Coordination normal.  Skin: Skin is warm, dry and intact.  Psychiatric: She has a normal mood and affect. Her behavior is normal. Judgment and thought content normal.  Nursing note and vitals reviewed.    ED Treatments / Results  Labs (all labs ordered are listed, but only abnormal results are displayed) Labs Reviewed  BASIC METABOLIC PANEL - Abnormal; Notable for the following components:      Result Value   Calcium 8.6 (*)    All other components within normal limits  CBC - Abnormal; Notable for the following components:   Platelets 130 (*)    All other components within normal limits  URINALYSIS, ROUTINE W REFLEX MICROSCOPIC - Abnormal; Notable for the following components:   Leukocytes, UA MODERATE (*)     Bacteria, UA RARE (*)    All other components within normal limits  URINE CULTURE  TROPONIN I  CBG MONITORING, ED    EKG None  Radiology No results found.  Procedures Procedures (including critical care time)  Medications Ordered in ED Medications  cephALEXin (KEFLEX) capsule 250 mg (has no administration in time range)     Initial Impression / Assessment and Plan / ED Course  I have reviewed the triage vital signs and the nursing notes.  Pertinent labs & imaging results that were available during my care of the patient were reviewed by me and considered in my medical decision making (see chart for details).  Clinical Course as of Aug 01 2049  Sat Aug 01, 2017  2041 Normal  Troponin I [EW]  2041 Pending  Urine culture [EW]  2042 normal  Basic metabolic panel(!) [EW]  2042 Abnormal, elevated leukocytes and WBC, and bacteria  Urinalysis, Routine w reflex microscopic(!) [EW]  2042 Normal   CBC(!) [EW]  2042 Elevated  BP(!): 187/85 [EW]    Clinical Course User Index [EW] [name], MD     Patient Vitals for the past 24 hrs:  BP Temp Temp src Pulse Resp SpO2 Height Weight  08/01/17 1739 (!) 187/85 98.4 F (36.9 C) Oral 76 18 97 %  (1.575 m) 70.3 kg (155 lb)    8:43 PM Reevaluation with update and discussion. After initial assessment and treatment, an updated evaluation reveals she is ambulating, comfortable, and has no further complaints.  Findings discussed with patient all questions were answered. [name]   Medical decision making-nonspecific malaise, possible urinary tract infection as cause.  Doubt sepsis, metabolic instability or impending vascular collapse.  Nursing Notes Reviewed/ Care Coordinated Applicable Imaging Reviewed Interpretation of Laboratory Data incorporated into ED treatment  The patient appears reasonably screened and/or stabilized for discharge and I doubt any other medical condition or other [facility] requiring further  screening, evaluation, or treatment in the ED at this time prior to discharge.  Plan: Home Medications-continue usual medications, APAP if needed for fever or pain; Home Treatments-rest, fluids; return here if the recommended treatment, does not improve the symptoms; Recommended follow up-PCP checkup 1 week.    Final Clinical Impressions(s) / ED Diagnoses   Final diagnoses:  Malaise  Urinary tract infection without hematuria, site unspecified    ED Discharge Orders        Ordered    cephALEXin (KEFLEX) 250 MG capsule  4 times daily     08/01/17 2051  [name], MD 08/01/17 2051

## 2017-08-03 LAB — URINE CULTURE: CULTURE: NO GROWTH

## 2017-09-23 ENCOUNTER — Emergency Department (HOSPITAL_COMMUNITY): Payer: Medicare HMO

## 2017-09-23 ENCOUNTER — Emergency Department (HOSPITAL_COMMUNITY)
Admission: EM | Admit: 2017-09-23 | Discharge: 2017-09-23 | Disposition: A | Discharge status: Home / Self Care | Payer: Medicare HMO | Attending: Emergency Medicine | Admitting: Emergency Medicine

## 2017-09-23 ENCOUNTER — Encounter (HOSPITAL_COMMUNITY): Payer: Self-pay | Admitting: Emergency Medicine

## 2017-09-23 DIAGNOSIS — R531 Weakness: Secondary | ICD-10-CM | POA: Diagnosis not present

## 2017-09-23 DIAGNOSIS — I10 Essential (primary) hypertension: Secondary | ICD-10-CM | POA: Insufficient documentation

## 2017-09-23 DIAGNOSIS — Z79899 Other long term (current) drug therapy: Secondary | ICD-10-CM | POA: Insufficient documentation

## 2017-09-23 DIAGNOSIS — R42 Dizziness and giddiness: Secondary | ICD-10-CM | POA: Diagnosis not present

## 2017-09-23 DIAGNOSIS — R4020 Unspecified coma: Secondary | ICD-10-CM | POA: Diagnosis not present

## 2017-09-23 LAB — COMPREHENSIVE METABOLIC PANEL
ALBUMIN: 4 g/dL (ref 3.5–5.0)
ALK PHOS: 58 U/L (ref 38–126)
ALT: 18 U/L (ref 14–54)
ANION GAP: 7 (ref 5–15)
AST: 19 U/L (ref 15–41)
BILIRUBIN TOTAL: 1.3 mg/dL — AB (ref 0.3–1.2)
BUN: 16 mg/dL (ref 6–20)
CALCIUM: 8.8 mg/dL — AB (ref 8.9–10.3)
CO2: 28 mmol/L (ref 22–32)
CREATININE: 0.64 mg/dL (ref 0.44–1.00)
Chloride: 106 mmol/L (ref 101–111)
GFR calc Af Amer: >60 mL/min (ref >60)
GFR calc non Af Amer: >60 mL/min (ref >60)
GLUCOSE: 96 mg/dL (ref 65–99)
Potassium: 3.7 mmol/L (ref 3.5–5.1)
Sodium: 141 mmol/L (ref 135–145)
TOTAL PROTEIN: 6.7 g/dL (ref 6.5–8.1)

## 2017-09-23 LAB — CBC WITH DIFFERENTIAL/PLATELET
BASOS PCT: 0 %
Basophils Absolute: 0 10*3/uL (ref 0.0–0.1)
Eosinophils Absolute: 0.1 10*3/uL (ref 0.0–0.7)
Eosinophils Relative: 2 %
HEMATOCRIT: 46.1 % — AB (ref 36.0–46.0)
Hemoglobin: 15.5 g/dL — ABNORMAL HIGH (ref 12.0–15.0)
LYMPHS ABS: 1.6 10*3/uL (ref 0.7–4.0)
Lymphocytes Relative: 35 %
MCH: 32.5 pg (ref 26.0–34.0)
MCHC: 33.6 g/dL (ref 30.0–36.0)
MCV: 96.6 fL (ref 78.0–100.0)
MONOS PCT: 9 %
Monocytes Absolute: 0.4 10*3/uL (ref 0.1–1.0)
NEUTROS ABS: 2.5 10*3/uL (ref 1.7–7.7)
NEUTROS PCT: 54 %
Platelets: 132 10*3/uL — ABNORMAL LOW (ref 150–400)
RBC: 4.77 MIL/uL (ref 3.87–5.11)
RDW: 14.4 % (ref 11.5–15.5)
WBC: 4.6 10*3/uL (ref 4.0–10.5)

## 2017-09-23 NOTE — ED Triage Notes (Signed)
Patient brought in by EMS, states she turned around too fast and became dizzy. Patient states she fell, but per EMS, son states she said "I'm dizzy" then backed up and lowered herself to the ground. Denies dizziness at triage. Denies pain.

## 2017-09-23 NOTE — Discharge Instructions (Addendum)
Drink plenty of fluids and follow-up with your doctor next week for recheck if any problems

## 2017-09-23 NOTE — ED Provider Notes (Signed)
[facility] EMERGENCY DEPARTMENT Provider Note   CSN: [id number] Arrival date & time: 09/23/17  7829     History   Chief Complaint Chief Complaint  Patient presents with  . Dizziness    HPI [name] is a 84 y.o. female.  Patient had one episode of dizziness which lasted less than a minute.  She went to the ground but did not lose consciousness.  She stated that she did feel some rotation of the room at the time the dizziness.  Patient feels fine now  The history is provided by the patient. No language interpreter was used.  Dizziness  Quality:  Head spinning Severity:  Mild Onset quality:  Sudden Duration: 1 minute. Timing:  Rare Progression:  Resolved Chronicity:  New Context: not when bending over   Associated symptoms: no chest pain, no diarrhea and no headaches     Past Medical History:  Diagnosis Date  . Chronic headache   . DDD (degenerative disc disease), cervical   . Generalized weakness   . Hypertension   . Hypoglycemia   . Pulmonary embolism (HCC)   . Sinusitis     There are no active problems to display for this patient.   Past Surgical History:  Procedure Laterality Date  . APPENDECTOMY       OB History    Gravida      Para      Term      Preterm      AB      Living  6     SAB      TAB      Ectopic      Multiple      Live Births               Home Medications    Prior to Admission medications   Medication Sig Start Date End Date Taking? Authorizing Provider  losartan (COZAAR) 100 MG tablet Take 100 mg by mouth every evening.    Yes [provider]  cephALEXin (KEFLEX) 250 MG capsule Take 1 capsule (250 mg total) by mouth 4 (four) times daily. 08/01/17   [name], MD    Family History History reviewed. No pertinent family history.  Social History Social History   Tobacco Use  . Smoking status: Never Smoker  . Smokeless tobacco: Never Used  Substance Use Topics  . Alcohol use: No  .  Drug use: No     Allergies   Codeine   Review of Systems Review of Systems  Constitutional: Negative for appetite change and fatigue.  HENT: Negative for congestion, ear discharge and sinus pressure.   Eyes: Negative for discharge.  Respiratory: Negative for cough.   Cardiovascular: Negative for chest pain.  Gastrointestinal: Negative for abdominal pain and diarrhea.  Genitourinary: Negative for frequency and hematuria.  Musculoskeletal: Negative for back pain.  Skin: Negative for rash.  Neurological: Positive for dizziness. Negative for seizures and headaches.  Psychiatric/Behavioral: Negative for hallucinations.     Physical Exam Updated Vital Signs BP 136/75   Pulse 95   Temp 97.7 F (36.5 C) (Oral)   Resp (!) 25   Ht 5\' 5"  (1.651 m)   Wt 72.6 kg (160 lb)   SpO2 95%   BMI 26.63 kg/m   Physical Exam  Constitutional: She is oriented to person, place, and time. She appears well-developed.  HENT:  Head: Normocephalic.  Eyes: Conjunctivae and EOM are normal. No scleral icterus.  Neck: Neck supple. No  thyromegaly present.  Cardiovascular: Normal rate and regular rhythm. Exam reveals no gallop and no friction rub.  No murmur heard. Pulmonary/Chest: No stridor. She has no wheezes. She has no rales. She exhibits no tenderness.  Abdominal: She exhibits no distension. There is no tenderness. There is no rebound.  Musculoskeletal: Normal range of motion. She exhibits no edema.  Lymphadenopathy:    She has no cervical adenopathy.  Neurological: She is oriented to person, place, and time. She exhibits normal muscle tone. Coordination normal.  Skin: No rash noted. No erythema.  Psychiatric: She has a normal mood and affect. Her behavior is normal.     ED Treatments / Results  Labs (all labs ordered are listed, but only abnormal results are displayed) Labs Reviewed  CBC WITH DIFFERENTIAL/PLATELET - Abnormal; Notable for the following components:      Result Value    Hemoglobin 15.5 (*)    HCT 46.1 (*)    Platelets 132 (*)    All other components within normal limits  COMPREHENSIVE METABOLIC PANEL - Abnormal; Notable for the following components:   Calcium 8.8 (*)    Total Bilirubin 1.3 (*)    All other components within normal limits    EKG EKG Interpretation  Date/Time:  Wednesday September 23 2017 08:31:39 EDT Ventricular Rate:  85 PR Interval:    QRS Duration: 112 QT Interval:  376 QTC Calculation: 448 R Axis:   -66 Text Interpretation:  Sinus rhythm Incomplete left bundle branch block LVH with secondary repolarization abnormality Anterior Q waves, possibly due to LVH Confirmed by Bethann BerkshireZammit, Isom Kochan [phone]([id number]) on 09/23/2017 9:59:36 AM   Radiology Ct Head Wo Contrast  Result Date: 09/23/2017 CLINICAL DATA:  Altered level of consciousness. EXAM: CT HEAD WITHOUT CONTRAST TECHNIQUE: Contiguous axial images were obtained from the base of the skull through the vertex without intravenous contrast. COMPARISON:  MRI 12/20/2015 FINDINGS: Brain: There is atrophy and chronic small vessel disease changes. No acute intracranial abnormality. Specifically, no hemorrhage, hydrocephalus, mass lesion, acute infarction, or significant intracranial injury. Vascular: No hyperdense vessel or unexpected calcification. Skull: No acute calvarial abnormality. Sinuses/Orbits: Visualized paranasal sinuses and mastoids clear. Orbital soft tissues unremarkable. Other: None IMPRESSION: No acute intracranial abnormality. Atrophy, chronic microvascular disease. Electronically Signed   By: Charlett NoseKevin  Dover M.D.   On: 09/23/2017 09:42    Procedures Procedures (including critical care time)  Medications Ordered in ED Medications - No data to display   Initial Impression / Assessment and Plan / ED Course  I have reviewed the triage vital signs and the nursing notes.  Pertinent labs & imaging results that were available during my care of the patient were reviewed by me and considered in my  medical decision making (see chart for details).     CT scan unremarkable CBC and chemistries unremarkable.  Patient with one episode of dizziness lasting less than a minute with no loss of consciousness.  She will be discharged home to follow-up with her PCP if any problems  Final Clinical Impressions(s) / ED Diagnoses   Final diagnoses:  Dizziness    ED Discharge Orders    None       Bethann BerkshireZammit, Kenzlie Disch, MD 09/23/17 1012

## 2017-11-25 DIAGNOSIS — N218 Other lower urinary tract calculus: Secondary | ICD-10-CM | POA: Diagnosis not present

## 2017-11-25 DIAGNOSIS — I10 Essential (primary) hypertension: Secondary | ICD-10-CM | POA: Diagnosis not present

## 2017-11-25 DIAGNOSIS — M542 Cervicalgia: Secondary | ICD-10-CM | POA: Diagnosis not present

## 2017-11-25 DIAGNOSIS — R42 Dizziness and giddiness: Secondary | ICD-10-CM | POA: Diagnosis not present

## 2017-11-25 DIAGNOSIS — Z6831 Body mass index (BMI) 31.0-31.9, adult: Secondary | ICD-10-CM | POA: Diagnosis not present

## 2017-11-25 DIAGNOSIS — S76312A Strain of muscle, fascia and tendon of the posterior muscle group at thigh level, left thigh, initial encounter: Secondary | ICD-10-CM | POA: Diagnosis not present

## 2017-11-25 DIAGNOSIS — N390 Urinary tract infection, site not specified: Secondary | ICD-10-CM | POA: Diagnosis not present

## 2017-11-25 DIAGNOSIS — R5383 Other fatigue: Secondary | ICD-10-CM | POA: Diagnosis not present

## 2017-11-25 DIAGNOSIS — Z6829 Body mass index (BMI) 29.0-29.9, adult: Secondary | ICD-10-CM | POA: Diagnosis not present

## 2017-11-25 DIAGNOSIS — Z6832 Body mass index (BMI) 32.0-32.9, adult: Secondary | ICD-10-CM | POA: Diagnosis not present

## 2017-11-25 DIAGNOSIS — R400 Somnolence: Secondary | ICD-10-CM | POA: Diagnosis not present

## 2017-11-25 DIAGNOSIS — R600 Localized edema: Secondary | ICD-10-CM | POA: Diagnosis not present

## 2017-11-25 DIAGNOSIS — I951 Orthostatic hypotension: Secondary | ICD-10-CM | POA: Diagnosis not present

## 2017-11-25 DIAGNOSIS — R300 Dysuria: Secondary | ICD-10-CM | POA: Diagnosis not present

## 2017-11-25 DIAGNOSIS — Z79899 Other long term (current) drug therapy: Secondary | ICD-10-CM | POA: Diagnosis not present

## 2017-12-16 DIAGNOSIS — I10 Essential (primary) hypertension: Secondary | ICD-10-CM | POA: Diagnosis not present

## 2017-12-16 DIAGNOSIS — R42 Dizziness and giddiness: Secondary | ICD-10-CM | POA: Diagnosis not present

## 2017-12-19 ENCOUNTER — Other Ambulatory Visit: Payer: Self-pay

## 2017-12-19 ENCOUNTER — Encounter (HOSPITAL_COMMUNITY): Payer: Self-pay

## 2017-12-19 ENCOUNTER — Emergency Department (HOSPITAL_COMMUNITY): Payer: Medicare HMO

## 2017-12-19 ENCOUNTER — Emergency Department (HOSPITAL_COMMUNITY)
Admission: EM | Admit: 2017-12-19 | Discharge: 2017-12-19 | Disposition: A | Discharge status: Home / Self Care | Payer: Medicare HMO | Attending: Emergency Medicine | Admitting: Emergency Medicine

## 2017-12-19 DIAGNOSIS — R51 Headache: Secondary | ICD-10-CM | POA: Diagnosis not present

## 2017-12-19 DIAGNOSIS — I10 Essential (primary) hypertension: Secondary | ICD-10-CM | POA: Insufficient documentation

## 2017-12-19 DIAGNOSIS — R519 Headache, unspecified: Secondary | ICD-10-CM

## 2017-12-19 DIAGNOSIS — Z79899 Other long term (current) drug therapy: Secondary | ICD-10-CM | POA: Insufficient documentation

## 2017-12-19 MED ORDER — ACETAMINOPHEN 325 MG PO TABS
650.0000 mg | ORAL_TABLET | Freq: Once | ORAL | Status: AC
  Administered 2017-12-19: 650 mg via ORAL
  Filled 2017-12-19: qty 2

## 2017-12-19 NOTE — ED Triage Notes (Signed)
Pt c/o headache x 3 days.  Denies n/v.  Reports headache has been gradually getting worse.   Denies injury, denies visual changes.

## 2017-12-19 NOTE — ED Notes (Signed)
Dr Zammit at bedside. 

## 2017-12-19 NOTE — ED Provider Notes (Signed)
[facility] EMERGENCY DEPARTMENT Provider Note   CSN: [id number] Arrival date & time: 12/19/17  0749     History   Chief Complaint Chief Complaint  Patient presents with  . Headache    HPI [name] is a 84 y.o. female.  Patient complains of headache for 3 days.  She did not take any medicine.  She has not had any nausea  The history is provided by the patient. No language interpreter was used.  Headache   This is a new problem. The current episode started more than 2 days ago. The problem occurs constantly. The headache is associated with nothing. The pain is located in the left unilateral region. The quality of the pain is described as dull. The pain is at a severity of 4/10. The pain is moderate. The pain does not radiate. Pertinent negatives include no anorexia.    Past Medical History:  Diagnosis Date  . Chronic headache   . DDD (degenerative disc disease), cervical   . Generalized weakness   . Hypertension   . Hypoglycemia   . Pulmonary embolism (HCC)   . Sinusitis     There are no active problems to display for this patient.   Past Surgical History:  Procedure Laterality Date  . APPENDECTOMY       OB History    Gravida      Para      Term      Preterm      AB      Living  6     SAB      TAB      Ectopic      Multiple      Live Births               Home Medications    Prior to Admission medications   Medication Sig Start Date End Date Taking? Authorizing Provider  cephALEXin (KEFLEX) 250 MG capsule Take 1 capsule (250 mg total) by mouth 4 (four) times daily. 08/01/17   [name], MD  losartan (COZAAR) 100 MG tablet Take 100 mg by mouth every evening.     [provider]    Family History No family history on file.  Social History Social History   Tobacco Use  . Smoking status: Never Smoker  . Smokeless tobacco: Never Used  Substance Use Topics  . Alcohol use: No  . Drug use: No     Allergies     Codeine   Review of Systems Review of Systems  Constitutional: Negative for appetite change and fatigue.  HENT: Negative for congestion, ear discharge and sinus pressure.   Eyes: Negative for discharge.  Respiratory: Negative for cough.   Cardiovascular: Negative for chest pain.  Gastrointestinal: Negative for abdominal pain, anorexia and diarrhea.  Genitourinary: Negative for frequency and hematuria.  Musculoskeletal: Negative for back pain.  Skin: Negative for rash.  Neurological: Positive for headaches. Negative for seizures.  Psychiatric/Behavioral: Negative for hallucinations.     Physical Exam Updated Vital Signs BP (!) 148/72 (BP Location: Right Arm)   Pulse 73   Temp 98.4 F (36.9 C) (Oral)   Resp 18   Wt 72.6 kg   SpO2 95%   BMI 26.63 kg/m   Physical Exam  Constitutional: She is oriented to person, place, and time. She appears well-developed.  HENT:  Head: Normocephalic.  Eyes: Conjunctivae and EOM are normal. No scleral icterus.  Neck: Neck supple. No thyromegaly present.  Cardiovascular: Normal rate  and regular rhythm. Exam reveals no gallop and no friction rub.  No murmur heard. Pulmonary/Chest: No stridor. She has no wheezes. She has no rales. She exhibits no tenderness.  Abdominal: She exhibits no distension. There is no tenderness. There is no rebound.  Musculoskeletal: Normal range of motion. She exhibits no edema.  Lymphadenopathy:    She has no cervical adenopathy.  Neurological: She is oriented to person, place, and time. She exhibits normal muscle tone. Coordination normal.  Skin: No rash noted. No erythema.  Psychiatric: She has a normal mood and affect. Her behavior is normal.     ED Treatments / Results  Labs (all labs ordered are listed, but only abnormal results are displayed) Labs Reviewed - No data to display  EKG None  Radiology Ct Head Wo Contrast  Result Date: 12/19/2017 CLINICAL DATA:  Three day history of headache. EXAM:  CT HEAD WITHOUT CONTRAST TECHNIQUE: Contiguous axial images were obtained from the base of the skull through the vertex without intravenous contrast. COMPARISON:  09/23/2017 FINDINGS: Brain: Stable age related cerebral atrophy, ventriculomegaly and periventricular white matter disease. No extra-axial fluid collections are identified. No CT findings for acute hemispheric infarction or intracranial hemorrhage. No mass lesions. The brainstem and cerebellum are normal. Vascular: No hyperdense vessel or unexpected calcification. Skull: No skull fracture or bone lesions. Sinuses/Orbits: The paranasal sinuses and mastoid air cells are grossly clear. Other: No scalp lesions or hematoma. IMPRESSION: 1. Stable age related cerebral atrophy, ventriculomegaly and periventricular white matter disease. 2. No acute intracranial findings or mass lesions. 3. No skull fracture. Electronically Signed   By: Rudie MeyerP.  Gallerani M.D.   On: 12/19/2017 08:57    Procedures Procedures (including critical care time)  Medications Ordered in ED Medications  acetaminophen (TYLENOL) tablet 650 mg (650 mg Oral Given 12/19/17 0830)     Initial Impression / Assessment and Plan / ED Course  I have reviewed the triage vital signs and the nursing notes.  Pertinent labs & imaging results that were available during my care of the patient were reviewed by me and considered in my medical decision making (see chart for details).     CT negative.  Patient improved with Tylenol.  She will follow-up with her PCP as needed  Final Clinical Impressions(s) / ED Diagnoses   Final diagnoses:  Bad headache    ED Discharge Orders    None       Bethann BerkshireZammit, Herlinda Heady, MD 12/19/17 [phone]

## 2017-12-19 NOTE — Discharge Instructions (Addendum)
Take Tylenol for headache and follow-up as needed with your doctor

## 2017-12-24 ENCOUNTER — Emergency Department (HOSPITAL_COMMUNITY)
Admission: EM | Admit: 2017-12-24 | Discharge: 2017-12-24 | Disposition: A | Discharge status: Home / Self Care | Payer: Medicare HMO | Attending: Emergency Medicine | Admitting: Emergency Medicine

## 2017-12-24 ENCOUNTER — Encounter (HOSPITAL_COMMUNITY): Payer: Self-pay | Admitting: Emergency Medicine

## 2017-12-24 ENCOUNTER — Other Ambulatory Visit: Payer: Self-pay

## 2017-12-24 DIAGNOSIS — R42 Dizziness and giddiness: Secondary | ICD-10-CM | POA: Diagnosis not present

## 2017-12-24 DIAGNOSIS — Z5321 Procedure and treatment not carried out due to patient leaving prior to being seen by health care provider: Secondary | ICD-10-CM | POA: Diagnosis not present

## 2017-12-24 NOTE — ED Triage Notes (Signed)
Pt reports was seen Saturday for similar. Pt reports dizziness today unchanged with movement or laying down.

## 2017-12-24 NOTE — ED Notes (Signed)
Pt son came to RN and stated his mom wanted to leave. Pt states she feels better and wishes to leave. Informed of delay and need to stay and be seen by EDP. Pt still desired to leave. Pt left with son in NAD.

## 2017-12-30 DIAGNOSIS — I10 Essential (primary) hypertension: Secondary | ICD-10-CM | POA: Diagnosis not present

## 2017-12-30 DIAGNOSIS — R5383 Other fatigue: Secondary | ICD-10-CM | POA: Diagnosis not present

## 2017-12-30 DIAGNOSIS — Z6829 Body mass index (BMI) 29.0-29.9, adult: Secondary | ICD-10-CM | POA: Diagnosis not present

## 2017-12-30 DIAGNOSIS — Z23 Encounter for immunization: Secondary | ICD-10-CM | POA: Diagnosis not present

## 2018-01-03 ENCOUNTER — Encounter (HOSPITAL_COMMUNITY): Payer: Self-pay

## 2018-01-03 ENCOUNTER — Emergency Department (HOSPITAL_COMMUNITY)
Admission: EM | Admit: 2018-01-03 | Discharge: 2018-01-03 | Disposition: A | Discharge status: Home / Self Care | Payer: Medicare HMO | Attending: Emergency Medicine | Admitting: Emergency Medicine

## 2018-01-03 ENCOUNTER — Other Ambulatory Visit: Payer: Self-pay

## 2018-01-03 DIAGNOSIS — Y999 Unspecified external cause status: Secondary | ICD-10-CM | POA: Insufficient documentation

## 2018-01-03 DIAGNOSIS — L03115 Cellulitis of right lower limb: Secondary | ICD-10-CM | POA: Diagnosis not present

## 2018-01-03 DIAGNOSIS — Y929 Unspecified place or not applicable: Secondary | ICD-10-CM | POA: Diagnosis not present

## 2018-01-03 DIAGNOSIS — S80821A Blister (nonthermal), right lower leg, initial encounter: Secondary | ICD-10-CM | POA: Diagnosis present

## 2018-01-03 DIAGNOSIS — X58XXXA Exposure to other specified factors, initial encounter: Secondary | ICD-10-CM | POA: Diagnosis not present

## 2018-01-03 DIAGNOSIS — I10 Essential (primary) hypertension: Secondary | ICD-10-CM | POA: Diagnosis not present

## 2018-01-03 DIAGNOSIS — Y939 Activity, unspecified: Secondary | ICD-10-CM | POA: Diagnosis not present

## 2018-01-03 DIAGNOSIS — Z23 Encounter for immunization: Secondary | ICD-10-CM | POA: Diagnosis not present

## 2018-01-03 LAB — CBC WITH DIFFERENTIAL/PLATELET
Basophils Absolute: 0 10*3/uL (ref 0.0–0.1)
Basophils Relative: 0 %
Eosinophils Absolute: 0.1 10*3/uL (ref 0.0–0.7)
Eosinophils Relative: 2 %
HEMATOCRIT: 43.5 % (ref 36.0–46.0)
HEMOGLOBIN: 14.5 g/dL (ref 12.0–15.0)
Lymphocytes Relative: 35 %
Lymphs Abs: 1.3 10*3/uL (ref 0.7–4.0)
MCH: 32.7 pg (ref 26.0–34.0)
MCHC: 33.3 g/dL (ref 30.0–36.0)
MCV: 98.2 fL (ref 78.0–100.0)
Monocytes Absolute: 0.3 10*3/uL (ref 0.1–1.0)
Monocytes Relative: 7 %
Neutro Abs: 2.1 10*3/uL (ref 1.7–7.7)
Neutrophils Relative %: 56 %
Platelets: 120 10*3/uL — ABNORMAL LOW (ref 150–400)
RBC: 4.43 MIL/uL (ref 3.87–5.11)
RDW: 14.5 % (ref 11.5–15.5)
WBC: 3.7 10*3/uL — ABNORMAL LOW (ref 4.0–10.5)

## 2018-01-03 LAB — COMPREHENSIVE METABOLIC PANEL
ALBUMIN: 3.4 g/dL — AB (ref 3.5–5.0)
ALK PHOS: 53 U/L (ref 38–126)
ALT: 14 U/L (ref 0–44)
AST: 16 U/L (ref 15–41)
Anion gap: 5 (ref 5–15)
BUN: 17 mg/dL (ref 8–23)
CO2: 29 mmol/L (ref 22–32)
CREATININE: 0.7 mg/dL (ref 0.44–1.00)
Calcium: 8.5 mg/dL — ABNORMAL LOW (ref 8.9–10.3)
Chloride: 107 mmol/L (ref 98–111)
GFR calc Af Amer: >60 mL/min (ref >60)
GFR calc non Af Amer: >60 mL/min (ref >60)
GLUCOSE: 100 mg/dL — AB (ref 70–99)
Potassium: 4 mmol/L (ref 3.5–5.1)
SODIUM: 141 mmol/L (ref 135–145)
Total Bilirubin: 1.4 mg/dL — ABNORMAL HIGH (ref 0.3–1.2)
Total Protein: 5.8 g/dL — ABNORMAL LOW (ref 6.5–8.1)

## 2018-01-03 MED ORDER — CEPHALEXIN 500 MG PO CAPS: 500.0000 mg | ORAL_CAPSULE | Freq: Three times a day (TID) | ORAL | 0 refills | Status: DC

## 2018-01-03 MED ORDER — SULFAMETHOXAZOLE-TRIMETHOPRIM 800-160 MG PO TABS: 1.0000 | ORAL_TABLET | Freq: Two times a day (BID) | ORAL | 0 refills | Status: DC

## 2018-01-03 MED ORDER — TETANUS-DIPHTH-ACELL PERTUSSIS 5-2.5-18.5 LF-MCG/0.5 IM SUSP
0.5000 mL | Freq: Once | INTRAMUSCULAR | Status: AC
  Administered 2018-01-03: 0.5 mL via INTRAMUSCULAR
  Filled 2018-01-03: qty 0.5

## 2018-01-03 MED ORDER — CEFAZOLIN SODIUM-DEXTROSE 1-4 GM/50ML-% IV SOLN
1.0000 g | Freq: Once | INTRAVENOUS | Status: AC
  Administered 2018-01-03: 1 g via INTRAVENOUS
  Filled 2018-01-03: qty 50

## 2018-01-03 NOTE — ED Provider Notes (Signed)
[facility] EMERGENCY DEPARTMENT Provider Note   CSN: [id number] Arrival date & time: 01/03/18  0509  Time seen 05:50 AM  History   Chief Complaint Chief Complaint  Patient presents with  . Wound Check    HPI [name] is a 84 y.o. female.  HPI patient is here with her son.  He states that she has had a problem earlier in the spring where she would get small water blisters on her legs that would come and go.  They decided it was because she was having more swelling in her legs because she had started sleeping sitting up and not elevating her legs.  Her doctor had put her on a "water pill and potassium pill" and it got better.  She now is sleeping in a bed.  However she saw her doctor on the 25th and at that time she had a small water blister that she had one small round Band-Aid on.  She then started putting a larger Band-Aid and Neosporin on the wounds.  Today her son noticed that there is an open "fleshy" area that she has not had before.  She denies any pain but she is aware of the open wound.  She denies any drainage however there is some obvious clear drainage seen.  She has not had any fever or chills.  Her son states that the redness on her right lower leg is not usual for her and is different.  She has never been told she has cellulitis before.  They are unaware when her last tetanus shot may have been.  PCP Benita Stabile, MD   Past Medical History:  Diagnosis Date  . Chronic headache   . DDD (degenerative disc disease), cervical   . Generalized weakness   . Hypertension   . Hypoglycemia   . Pulmonary embolism (HCC)   . Sinusitis     There are no active problems to display for this patient.   Past Surgical History:  Procedure Laterality Date  . APPENDECTOMY       OB History    Gravida      Para      Term      Preterm      AB      Living  6     SAB      TAB      Ectopic      Multiple      Live Births               Home Medications      Prior to Admission medications   Medication Sig Start Date End Date Taking? Authorizing Provider  cephALEXin (KEFLEX) 500 MG capsule Take 1 capsule (500 mg total) by mouth 3 (three) times daily. 01/03/18   Devoria Albe, MD  losartan (COZAAR) 100 MG tablet Take 100 mg by mouth every evening.     [provider]  sulfamethoxazole-trimethoprim (BACTRIM DS,SEPTRA DS) 800-160 MG tablet Take 1 tablet by mouth 2 (two) times daily. 01/03/18   Devoria Albe, MD    Family History History reviewed. No pertinent family history.  Social History Social History   Tobacco Use  . Smoking status: Never Smoker  . Smokeless tobacco: Never Used  Substance Use Topics  . Alcohol use: No  . Drug use: No  lives with son   Allergies   Codeine   Review of Systems Review of Systems  All other systems reviewed and are negative.    Physical  Exam Updated Vital Signs BP (!) 161/89 (BP Location: Left Arm)   Pulse 78   Temp 97.8 F (36.6 C) (Oral)   Resp 18   Ht 5\' 2"  (1.575 m)   Wt 68.9 kg   SpO2 93%   BMI 27.80 kg/m   Vital signs normal except for hypertension   Physical Exam  Constitutional: She is oriented to person, place, and time. She appears well-developed and well-nourished. No distress.  HENT:  Head: Normocephalic and atraumatic.  Right Ear: External ear normal.  Left Ear: External ear normal.  Nose: Nose normal.  Eyes: Conjunctivae and EOM are normal.  Neck: Normal range of motion.  Cardiovascular: Normal rate.  Pulmonary/Chest: Effort normal. No respiratory distress.  Musculoskeletal: She exhibits edema and tenderness. She exhibits no deformity.  Patient has a 2 x 5 cm rectangular type superficial ulcer on the anterior aspect of her right lower leg just below midline.  Around it and distal to it to the ankle there is increased redness and warmth and swelling of the whole leg.  She has some chronic changes of the skin in her left leg with some mild redness which the son  states is usual.  She has some intact blisters medially and she is having some clear serosanguineous fluid drainage.  Neurological: She is alert and oriented to person, place, and time. No cranial nerve deficit.  Skin: Skin is warm and dry. There is erythema.  Psychiatric: She has a normal mood and affect. Her behavior is normal. Thought content normal.  Nursing note and vitals reviewed.           ED Treatments / Results  Labs (all labs ordered are listed, but only abnormal results are displayed) Labs Reviewed  COMPREHENSIVE METABOLIC PANEL - Abnormal; Notable for the following components:      Result Value   Glucose, Bld 100 (*)    Calcium 8.5 (*)    Total Protein 5.8 (*)    Albumin 3.4 (*)    Total Bilirubin 1.4 (*)    All other components within normal limits  CBC WITH DIFFERENTIAL/PLATELET - Abnormal; Notable for the following components:   WBC 3.7 (*)    Platelets 120 (*)    All other components within normal limits    EKG None  Radiology No results found.  Procedures Procedures (including critical care time)  Medications Ordered in ED Medications  ceFAZolin (ANCEF) IVPB 1 g/50 mL premix (0 g Intravenous Stopped 01/03/18 0654)  Tdap (BOOSTRIX) injection 0.5 mL (0.5 mLs Intramuscular Given 01/03/18 0622)     Initial Impression / Assessment and Plan / ED Course  I have reviewed the triage vital signs and the nursing notes.  Pertinent labs & imaging results that were available during my care of the patient were reviewed by me and considered in my medical decision making (see chart for details).     I reviewed patient's epic records there are no booster for tetanus found.  She was given a booster for her tetanus.  IV was inserted and patient was given IV antibiotics for cellulitis.  Laboratory testing was done.  07:00 AM patient has finished her antibiotics.  He has no fever, chills, nausea or vomiting.  She is able to take pills without difficulty.  The area  of cellulitis is very small.  At this point I think patient can try outpatient treatment and if it is getting worse and I talked to the son about things to watch for they can return  for admission.    Final Clinical Impressions(s) / ED Diagnoses   Final diagnoses:  Cellulitis of right lower limb    ED Discharge Orders         Ordered    sulfamethoxazole-trimethoprim (BACTRIM DS,SEPTRA DS) 800-160 MG tablet  2 times daily     01/03/18 0709    cephALEXin (KEFLEX) 500 MG capsule  3 times daily     01/03/18 1610         Plan discharge  Devoria Albe, MD, Concha Pyo, MD 01/03/18 [phone]

## 2018-01-03 NOTE — Discharge Instructions (Addendum)
Elevate your leg. Keep it clean and dry. Take the antibiotics until gone. Let your doctor know if it isn't improving over the next 48 hours or return to the ED if you get nausea, vomiting, a fever, chills or the redness spreads up to your knee.

## 2018-01-03 NOTE — ED Triage Notes (Signed)
Pt states 'water type blister' that she noticed a couple days ago, is now larger and painful approx 2''x1'' on right lower leg.

## 2018-01-07 DIAGNOSIS — L03115 Cellulitis of right lower limb: Secondary | ICD-10-CM | POA: Diagnosis not present

## 2018-01-07 DIAGNOSIS — I10 Essential (primary) hypertension: Secondary | ICD-10-CM | POA: Diagnosis not present

## 2018-01-07 DIAGNOSIS — R5383 Other fatigue: Secondary | ICD-10-CM | POA: Diagnosis not present

## 2018-03-11 DIAGNOSIS — Z23 Encounter for immunization: Secondary | ICD-10-CM | POA: Diagnosis not present

## 2018-06-08 ENCOUNTER — Other Ambulatory Visit: Payer: Self-pay

## 2018-06-08 ENCOUNTER — Emergency Department (HOSPITAL_COMMUNITY): Payer: Medicare HMO

## 2018-06-08 ENCOUNTER — Emergency Department (HOSPITAL_COMMUNITY)
Admission: EM | Admit: 2018-06-08 | Discharge: 2018-06-08 | Disposition: A | Discharge status: Home / Self Care | Payer: Medicare HMO | Attending: Emergency Medicine | Admitting: Emergency Medicine

## 2018-06-08 DIAGNOSIS — I10 Essential (primary) hypertension: Secondary | ICD-10-CM | POA: Insufficient documentation

## 2018-06-08 DIAGNOSIS — Z79899 Other long term (current) drug therapy: Secondary | ICD-10-CM | POA: Diagnosis not present

## 2018-06-08 DIAGNOSIS — R5383 Other fatigue: Secondary | ICD-10-CM | POA: Diagnosis not present

## 2018-06-08 DIAGNOSIS — R531 Weakness: Secondary | ICD-10-CM | POA: Diagnosis not present

## 2018-06-08 LAB — CBC WITH DIFFERENTIAL/PLATELET
Abs Immature Granulocytes: 0.01 10*3/uL (ref 0.00–0.07)
BASOS ABS: 0 10*3/uL (ref 0.0–0.1)
Basophils Relative: 1 %
EOS PCT: 2 %
Eosinophils Absolute: 0.1 10*3/uL (ref 0.0–0.5)
HEMATOCRIT: 47.5 % — AB (ref 36.0–46.0)
HEMOGLOBIN: 15.6 g/dL — AB (ref 12.0–15.0)
Immature Granulocytes: 0 %
LYMPHS ABS: 1.7 10*3/uL (ref 0.7–4.0)
LYMPHS PCT: 39 %
MCH: 32.6 pg (ref 26.0–34.0)
MCHC: 32.8 g/dL (ref 30.0–36.0)
MCV: 99.4 fL (ref 80.0–100.0)
MONO ABS: 0.4 10*3/uL (ref 0.1–1.0)
Monocytes Relative: 10 %
NRBC: 0 % (ref 0.0–0.2)
Neutro Abs: 2 10*3/uL (ref 1.7–7.7)
Neutrophils Relative %: 48 %
Platelets: 119 10*3/uL — ABNORMAL LOW (ref 150–400)
RBC: 4.78 MIL/uL (ref 3.87–5.11)
RDW: 13.5 % (ref 11.5–15.5)
WBC: 4.2 10*3/uL (ref 4.0–10.5)

## 2018-06-08 LAB — URINALYSIS, ROUTINE W REFLEX MICROSCOPIC
BILIRUBIN URINE: NEGATIVE
GLUCOSE, UA: NEGATIVE mg/dL
HGB URINE DIPSTICK: NEGATIVE
KETONES UR: NEGATIVE mg/dL
Leukocytes,Ua: NEGATIVE
NITRITE: NEGATIVE
PH: 8 (ref 5.0–8.0)
PROTEIN: NEGATIVE mg/dL
Specific Gravity, Urine: 1.005 (ref 1.005–1.030)

## 2018-06-08 LAB — COMPREHENSIVE METABOLIC PANEL
ALBUMIN: 3.9 g/dL (ref 3.5–5.0)
ALK PHOS: 45 U/L (ref 38–126)
ALT: 15 U/L (ref 0–44)
ANION GAP: 9 (ref 5–15)
AST: 18 U/L (ref 15–41)
BILIRUBIN TOTAL: 1.1 mg/dL (ref 0.3–1.2)
BUN: 22 mg/dL (ref 8–23)
CALCIUM: 8.9 mg/dL (ref 8.9–10.3)
CO2: 28 mmol/L (ref 22–32)
CREATININE: 0.71 mg/dL (ref 0.44–1.00)
Chloride: 103 mmol/L (ref 98–111)
GFR calc Af Amer: >60 mL/min (ref >60)
GFR calc non Af Amer: >60 mL/min (ref >60)
GLUCOSE: 102 mg/dL — AB (ref 70–99)
Potassium: 3.9 mmol/L (ref 3.5–5.1)
Sodium: 140 mmol/L (ref 135–145)
TOTAL PROTEIN: 6.6 g/dL (ref 6.5–8.1)

## 2018-06-08 LAB — TROPONIN I: Troponin I: <0.03 ng/mL (ref <0.03)

## 2018-06-08 LAB — INFLUENZA PANEL BY PCR (TYPE A & B)
Influenza A By PCR: NEGATIVE
Influenza B By PCR: NEGATIVE

## 2018-06-08 MED ORDER — SODIUM CHLORIDE 0.9 % IV BOLUS
250.0000 mL | Freq: Once | INTRAVENOUS | Status: AC
  Administered 2018-06-08: 250 mL via INTRAVENOUS

## 2018-06-08 NOTE — ED Notes (Signed)
ED Provider at bedside. 

## 2018-06-08 NOTE — ED Triage Notes (Signed)
Pt c/o generalized body aches for the last few days; pt denies any pain states she feels real tired

## 2018-06-08 NOTE — ED Notes (Signed)
Patient transported to X-ray 

## 2018-06-08 NOTE — ED Provider Notes (Signed)
[facility] EMERGENCY DEPARTMENT Provider Note   CSN: [id number] Arrival date & time: 06/08/18  5537    History   Chief Complaint Chief Complaint  Patient presents with  . Generalized Body Aches    HPI [name] is a 85 y.o. female.     Patient presents to the emergency department for evaluation of weakness.  Symptoms have been present for a couple of days.  Son reports that she has not been very active.  Patient reports that she feels sleepy and falls asleep very easily.  She has not had any other specific symptoms.  She denies chest pain, cough, shortness of breath.  No vomiting, diarrhea.  She has not had any rash.  She has not had fever.  Patient denies urinary symptoms.  No rectal bleeding or melena.     Past Medical History:  Diagnosis Date  . Chronic headache   . DDD (degenerative disc disease), cervical   . Generalized weakness   . Hypertension   . Hypoglycemia   . Pulmonary embolism (HCC)   . Sinusitis     There are no active problems to display for this patient.   Past Surgical History:  Procedure Laterality Date  . APPENDECTOMY       OB History    Gravida      Para      Term      Preterm      AB      Living  6     SAB      TAB      Ectopic      Multiple      Live Births               Home Medications    Prior to Admission medications   Medication Sig Start Date End Date Taking? Authorizing Provider  losartan (COZAAR) 100 MG tablet Take 100 mg by mouth every evening.    Yes [provider]  cephALEXin (KEFLEX) 500 MG capsule Take 1 capsule (500 mg total) by mouth 3 (three) times daily. 01/03/18   Devoria Albe, MD  sulfamethoxazole-trimethoprim (BACTRIM DS,SEPTRA DS) 800-160 MG tablet Take 1 tablet by mouth 2 (two) times daily. 01/03/18   Devoria Albe, MD    Family History No family history on file.  Social History Social History   Tobacco Use  . Smoking status: Never Smoker  . Smokeless tobacco: Never Used    Substance Use Topics  . Alcohol use: No  . Drug use: No     Allergies   Codeine   Review of Systems Review of Systems  Constitutional: Positive for fatigue.  All other systems reviewed and are negative.    Physical Exam Updated Vital Signs BP (!) 165/94 (BP Location: Right Arm)   Pulse 83   Temp 98.2 F (36.8 C) (Oral)   Resp 19   SpO2 100%   Physical Exam Vitals signs and nursing note reviewed.  Constitutional:      General: She is not in acute distress.    Appearance: Normal appearance. She is well-developed.  HENT:     Head: Normocephalic and atraumatic.     Right Ear: Hearing normal.     Left Ear: Hearing normal.     Nose: Nose normal.  Eyes:     Conjunctiva/sclera: Conjunctivae normal.     Pupils: Pupils are equal, round, and reactive to light.  Neck:     Musculoskeletal: Normal range of motion and neck supple.  Cardiovascular:     Rate and Rhythm: Regular rhythm.     Heart sounds: S1 normal and S2 normal. No murmur. No friction rub. No gallop.   Pulmonary:     Effort: Pulmonary effort is normal. No respiratory distress.     Breath sounds: Normal breath sounds.  Chest:     Chest wall: No tenderness.  Abdominal:     General: Bowel sounds are normal.     Palpations: Abdomen is soft.     Tenderness: There is no abdominal tenderness. There is no guarding or rebound. Negative signs include Murphy's sign and McBurney's sign.     Hernia: No hernia is present.  Musculoskeletal: Normal range of motion.  Skin:    General: Skin is warm and dry.     Findings: No rash.  Neurological:     Mental Status: She is alert and oriented to person, place, and time.     GCS: GCS eye subscore is 4. GCS verbal subscore is 5. GCS motor subscore is 6.     Cranial Nerves: No cranial nerve deficit.     Sensory: No sensory deficit.     Coordination: Coordination normal.  Psychiatric:        Speech: Speech normal.        Behavior: Behavior normal.        Thought Content:  Thought content normal.      ED Treatments / Results  Labs (all labs ordered are listed, but only abnormal results are displayed) Labs Reviewed  CBC WITH DIFFERENTIAL/PLATELET - Abnormal; Notable for the following components:      Result Value   Hemoglobin 15.6 (*)    HCT 47.5 (*)    Platelets 119 (*)    All other components within normal limits  COMPREHENSIVE METABOLIC PANEL - Abnormal; Notable for the following components:   Glucose, Bld 102 (*)    All other components within normal limits  URINALYSIS, ROUTINE W REFLEX MICROSCOPIC - Abnormal; Notable for the following components:   Color, Urine STRAW (*)    All other components within normal limits  TROPONIN I  INFLUENZA PANEL BY PCR (TYPE A & B)    EKG EKG Interpretation  Date/Time:  Tuesday June 08 2018 06:06:35 EST Ventricular Rate:  83 PR Interval:    QRS Duration: 107 QT Interval:  365 QTC Calculation: 429 R Axis:   -50 Text Interpretation:  Normal sinus rhythm Incomplete right bundle branch block Abnormal R-wave progression, late transition Nonspecific T abnormalities, lateral leads No significant change since last tracing Confirmed by Gilda CreasePollina, Kealii Thueson J [phone]([id number]) on 06/08/2018 6:16:52 AM   Radiology Dg Chest 2 View  Result Date: 06/08/2018 CLINICAL DATA:  85 year old female with weakness and generalized body aches. EXAM: CHEST - 2 VIEW COMPARISON:  04/28/2015 and earlier. FINDINGS: Chronic cardiomegaly and tortuosity of the thoracic aorta are stable. Stable lung volumes with no pneumothorax, pulmonary edema, pleural effusion or confluent pulmonary opacity. Negative visible bowel gas pattern. Visualized tracheal air column is within normal limits. No acute osseous abnormality identified. IMPRESSION: Stable since 2015.  No acute cardiopulmonary abnormality. Electronically Signed   By: Odessa FlemingH  Hall M.D.   On: 06/08/2018 07:08    Procedures Procedures (including critical care time)  Medications Ordered in  ED Medications  sodium chloride 0.9 % bolus 250 mL (250 mLs Intravenous New Bag/Given 06/08/18 0614)     Initial Impression / Assessment and Plan / ED Course  I have reviewed the triage vital signs and the nursing notes.  Pertinent labs & imaging results that were available during my care of the patient were reviewed by me and considered in my medical decision making (see chart for details).        Patient presents to the emergency department for evaluation of weakness and increased sleepiness.  She has had some myalgias associated as well but no fever.  Patient has no other complaints.  She appears well.  She is awake and alert.  She has ambulated to the bathroom here without difficulty.  No hypoxia.  Son concerned about the fact that she had a blood clot years ago.  At that time it was discovered because of shortness of breath and hypoxia.  She has no symptoms that would suggest that she has a clot today.  Her work-up has been reassuring and unrevealing.  No anemia, electrolyte abnormality, kidney disease.  Her urinalysis was normal, no sign of infection.  Chest x-ray is clear, no evidence of pneumonia or other abnormality.  Patient and son reassured, can be discharged and follow-up with primary care.  Final Clinical Impressions(s) / ED Diagnoses   Final diagnoses:  Fatigue, unspecified type    ED Discharge Orders    None       Gilda Crease, MD 06/08/18 [phone]

## 2018-09-10 ENCOUNTER — Emergency Department (HOSPITAL_COMMUNITY): Payer: Medicare HMO

## 2018-09-10 ENCOUNTER — Emergency Department (HOSPITAL_COMMUNITY)
Admission: EM | Admit: 2018-09-10 | Discharge: 2018-09-11 | Disposition: A | Discharge status: Home / Self Care | Payer: Medicare HMO | Attending: Emergency Medicine | Admitting: Emergency Medicine

## 2018-09-10 ENCOUNTER — Encounter (HOSPITAL_COMMUNITY): Payer: Self-pay | Admitting: Emergency Medicine

## 2018-09-10 ENCOUNTER — Other Ambulatory Visit: Payer: Self-pay

## 2018-09-10 DIAGNOSIS — Y999 Unspecified external cause status: Secondary | ICD-10-CM | POA: Insufficient documentation

## 2018-09-10 DIAGNOSIS — Z79899 Other long term (current) drug therapy: Secondary | ICD-10-CM | POA: Diagnosis not present

## 2018-09-10 DIAGNOSIS — M25511 Pain in right shoulder: Secondary | ICD-10-CM | POA: Diagnosis not present

## 2018-09-10 DIAGNOSIS — S42291A Other displaced fracture of upper end of right humerus, initial encounter for closed fracture: Secondary | ICD-10-CM | POA: Diagnosis not present

## 2018-09-10 DIAGNOSIS — R52 Pain, unspecified: Secondary | ICD-10-CM | POA: Diagnosis not present

## 2018-09-10 DIAGNOSIS — R42 Dizziness and giddiness: Secondary | ICD-10-CM | POA: Diagnosis not present

## 2018-09-10 DIAGNOSIS — I10 Essential (primary) hypertension: Secondary | ICD-10-CM | POA: Insufficient documentation

## 2018-09-10 DIAGNOSIS — Y939 Activity, unspecified: Secondary | ICD-10-CM | POA: Insufficient documentation

## 2018-09-10 DIAGNOSIS — S42201A Unspecified fracture of upper end of right humerus, initial encounter for closed fracture: Secondary | ICD-10-CM | POA: Diagnosis not present

## 2018-09-10 DIAGNOSIS — R0689 Other abnormalities of breathing: Secondary | ICD-10-CM | POA: Diagnosis not present

## 2018-09-10 DIAGNOSIS — W010XXA Fall on same level from slipping, tripping and stumbling without subsequent striking against object, initial encounter: Secondary | ICD-10-CM | POA: Insufficient documentation

## 2018-09-10 DIAGNOSIS — S42294A Other nondisplaced fracture of upper end of right humerus, initial encounter for closed fracture: Secondary | ICD-10-CM

## 2018-09-10 DIAGNOSIS — R0902 Hypoxemia: Secondary | ICD-10-CM | POA: Diagnosis not present

## 2018-09-10 DIAGNOSIS — Y929 Unspecified place or not applicable: Secondary | ICD-10-CM | POA: Insufficient documentation

## 2018-09-10 DIAGNOSIS — S4991XA Unspecified injury of right shoulder and upper arm, initial encounter: Secondary | ICD-10-CM | POA: Diagnosis present

## 2018-09-10 DIAGNOSIS — W19XXXA Unspecified fall, initial encounter: Secondary | ICD-10-CM | POA: Diagnosis not present

## 2018-09-10 MED ORDER — FENTANYL CITRATE (PF) 100 MCG/2ML IJ SOLN
50.0000 ug | Freq: Once | INTRAMUSCULAR | Status: AC
  Administered 2018-09-10: 50 ug via INTRAVENOUS
  Filled 2018-09-10: qty 2

## 2018-09-10 NOTE — ED Triage Notes (Signed)
Patient brought in by EMS. Patient tripped and fell landing on the wooden floor hitting her right shoulder. Patient having severe right shoulder pain. EMS gave a total of 5 mg's of morphine en route. Patient alert and oriented.

## 2018-09-10 NOTE — ED Provider Notes (Signed)
Emergency Department Provider Note   I have reviewed the triage vital signs and the nursing notes.   HISTORY  Chief Complaint Fall (right shoulder pain)   HPI [name] is a 85 y.o. female who presents after a fall with right shoulder pain. Triage note states she tripped and fell on right shoulder. She endorses the fall but thinks it was because she was dizzy. She states she did not pass out or hit or head. She states no pain elsewhere.    No other associated or modifying symptoms.    Past Medical History:  Diagnosis Date  . Chronic headache   . DDD (degenerative disc disease), cervical   . Generalized weakness   . Hypertension   . Hypoglycemia   . Pulmonary embolism (HCC)   . Sinusitis     There are no active problems to display for this patient.   Past Surgical History:  Procedure Laterality Date  . APPENDECTOMY      Current Outpatient Rx  . Order #: [id number] Class: Normal  . Order #: [id number] Class: Historical Med  . Order #: [id number] Class: Print  . Order #: [id number] Class: Print  . Order #: [id number] Class: Normal    Allergies Codeine  No family history on file.  Social History Social History   Tobacco Use  . Smoking status: Never Smoker  . Smokeless tobacco: Never Used  Substance Use Topics  . Alcohol use: No  . Drug use: No    Review of Systems  All other systems negative except as documented in the HPI. All pertinent positives and negatives as reviewed in the HPI. ____________________________________________   PHYSICAL EXAM:  VITAL SIGNS: ED Triage Vitals  Enc Vitals Group     BP 09/10/18 2302 (!) 173/71     Pulse Rate 09/10/18 2302 82     Resp 09/10/18 2302 18     Temp 09/10/18 2302 98.9 F (37.2 C)     Temp Source 09/10/18 2302 Oral     SpO2 09/10/18 2302 94 %     Weight 09/10/18 2303 135 lb (61.2 kg)     Height 09/10/18 2303 5\' 2"  (1.575 m)    Constitutional: Alert and oriented. Well appearing and in no acute  distress. Eyes: Conjunctivae are normal. PERRL. EOMI. Head: Atraumatic. Nose: No congestion/rhinnorhea. Mouth/Throat: Mucous membranes are moist.  Oropharynx non-erythematous. Neck: No stridor.  No meningeal signs.   Cardiovascular: Normal rate, regular rhythm. Good peripheral circulation. Grossly normal heart sounds.   Respiratory: Normal respiratory effort.  No retractions. Lungs CTAB. Gastrointestinal: Soft and nontender. No distention.  Musculoskeletal: everywhere I touch seems to make her right shoulder hurt worse but doesn't seem to have injuries elsewhere. No deformity of shoulder but is guarding from a full examination.  Neurologic:  Normal speech and language. No gross focal neurologic deficits are appreciated.  Skin:  Skin is warm, dry and intact. No rash noted.   ____________________________________________   LABS (all labs ordered are listed, but only abnormal results are displayed)  Labs Reviewed  CBC WITH DIFFERENTIAL/PLATELET - Abnormal; Notable for the following components:      Result Value   Hemoglobin 15.1 (*)    HCT 46.1 (*)    Platelets 120 (*)    All other components within normal limits  BASIC METABOLIC PANEL - Abnormal; Notable for the following components:   Glucose, Bld 122 (*)    Calcium 8.7 (*)    All other components within normal limits   ____________________________________________  EKG  EKG Interpretation  Date/Time:  Friday September 10 2018 23:20:46 EDT Ventricular Rate:  80 PR Interval:    QRS Duration: 106 QT Interval:  356 QTC Calculation: 411 R Axis:   -48 Text Interpretation:  Sinus rhythm Incomplete RBBB and LAFB Probable left ventricular hypertrophy Anterior Q waves, possibly due to LVH Nonspecific T abnormalities, lateral leads no significant change from multiple previous ecg's Confirmed by Marily Memos [phone]) on 09/10/2018 11:23:50 PM       ____________________________________________  RADIOLOGY  Dg Shoulder Right  Result  Date: 09/11/2018 CLINICAL DATA:  Right shoulder pain. EXAM: RIGHT SHOULDER - 2+ VIEW COMPARISON:  None. FINDINGS: Evaluation is limited by suboptimal patient positioning. There appears to be an acute impacted fracture of the proximal right humerus, however evaluation is limited by positioning and lack of additional views. There is no evidence of a glenohumeral dislocation. There is osteopenia. IMPRESSION: 1. Limited study secondary to technique. 2. Findings suspicious for an acute impacted fracture involving the proximal right humerus. Consider repeat radiographs for further evaluation. 3. No glenohumeral dislocation. Electronically Signed   By: Katherine Mantle M.D.   On: 09/11/2018 00:02    ____________________________________________   PROCEDURES  Procedure(s) performed:   Procedures   ____________________________________________   INITIAL IMPRESSION / ASSESSMENT AND PLAN / ED COURSE  Ecg/labs for ?dizziness. Shoulder xr for trauma.   Did not participate well with shoulder x-ray secondary to pain.  Does appear that she has a proximal humerus fracture with is consistent with her presentation.  We will put in a immobilizer, pain control and have her follow-up with orthopedics.  She lives with her son who have spoken with in person and he thinks he can take care of her at home but would appreciate physical therapy assistance if available.   Pertinent labs & imaging results that were available during my care of the patient were reviewed by me and considered in my medical decision making (see chart for details).  A medical screening exam was performed and I feel the patient has had an appropriate workup for their chief complaint at this time and likelihood of emergent condition existing is low. They have been counseled on decision, discharge, follow up and which symptoms necessitate immediate return to the emergency department. They or their family verbally stated understanding and agreement  with plan and discharged in stable condition.   ____________________________________________  FINAL CLINICAL IMPRESSION(S) / ED DIAGNOSES  Final diagnoses:  Other closed nondisplaced fracture of proximal end of right humerus, initial encounter    MEDICATIONS GIVEN DURING THIS VISIT:  Medications  oxyCODONE-acetaminophen (PERCOCET/ROXICET) 5-325 MG per tablet 1 tablet (has no administration in time range)  fentaNYL (SUBLIMAZE) injection 50 mcg (50 mcg Intravenous Given 09/10/18 2317)     NEW OUTPATIENT MEDICATIONS STARTED DURING THIS VISIT:  New Prescriptions   OXYCODONE-ACETAMINOPHEN (PERCOCET) 5-325 MG TABLET    Take 1 tablet by mouth every 8 (eight) hours as needed for severe pain.   OXYCODONE-ACETAMINOPHEN (PERCOCET) 5-325 MG TABLET    Take 1 tablet by mouth every 8 (eight) hours as needed for severe pain.    Note:  This note was prepared with assistance of Dragon voice recognition software. Occasional wrong-word or sound-a-like substitutions may have occurred due to the inherent limitations of voice recognition software.   Ayleen Mckinstry, Barbara Cower, MD 09/11/18 0030

## 2018-09-11 LAB — CBC WITH DIFFERENTIAL/PLATELET
Abs Immature Granulocytes: 0.02 10*3/uL (ref 0.00–0.07)
Basophils Absolute: 0 10*3/uL (ref 0.0–0.1)
Basophils Relative: 0 %
Eosinophils Absolute: 0 10*3/uL (ref 0.0–0.5)
Eosinophils Relative: 1 %
HCT: 46.1 % — ABNORMAL HIGH (ref 36.0–46.0)
Hemoglobin: 15.1 g/dL — ABNORMAL HIGH (ref 12.0–15.0)
Immature Granulocytes: 0 %
Lymphocytes Relative: 30 %
Lymphs Abs: 1.3 10*3/uL (ref 0.7–4.0)
MCH: 32.5 pg (ref 26.0–34.0)
MCHC: 32.8 g/dL (ref 30.0–36.0)
MCV: 99.4 fL (ref 80.0–100.0)
Monocytes Absolute: 0.3 10*3/uL (ref 0.1–1.0)
Monocytes Relative: 7 %
Neutro Abs: 2.7 10*3/uL (ref 1.7–7.7)
Neutrophils Relative %: 62 %
Platelets: 120 10*3/uL — ABNORMAL LOW (ref 150–400)
RBC: 4.64 MIL/uL (ref 3.87–5.11)
RDW: 13.8 % (ref 11.5–15.5)
WBC: 4.5 10*3/uL (ref 4.0–10.5)
nRBC: 0 % (ref 0.0–0.2)

## 2018-09-11 LAB — BASIC METABOLIC PANEL
Anion gap: 8 (ref 5–15)
BUN: 19 mg/dL (ref 8–23)
CO2: 26 mmol/L (ref 22–32)
Calcium: 8.7 mg/dL — ABNORMAL LOW (ref 8.9–10.3)
Chloride: 104 mmol/L (ref 98–111)
Creatinine, Ser: 0.64 mg/dL (ref 0.44–1.00)
GFR calc Af Amer: >60 mL/min (ref >60)
GFR calc non Af Amer: >60 mL/min (ref >60)
Glucose, Bld: 122 mg/dL — ABNORMAL HIGH (ref 70–99)
Potassium: 4.1 mmol/L (ref 3.5–5.1)
Sodium: 138 mmol/L (ref 135–145)

## 2018-09-11 MED ORDER — OXYCODONE-ACETAMINOPHEN 5-325 MG PO TABS
1.0000 | ORAL_TABLET | Freq: Once | ORAL | Status: AC
  Administered 2018-09-11: 1 via ORAL
  Filled 2018-09-11: qty 1

## 2018-09-11 MED ORDER — OXYCODONE-ACETAMINOPHEN 5-325 MG PO TABS: 1.0000 | ORAL_TABLET | Freq: Three times a day (TID) | ORAL | 0 refills | Status: DC | PRN

## 2018-09-11 NOTE — ED Notes (Signed)
Shoulder immobilizer applied to right arm

## 2018-09-13 MED FILL — Oxycodone w/ Acetaminophen Tab 5-325 MG: ORAL | Qty: 6 | Status: AC

## 2018-09-15 DIAGNOSIS — M25511 Pain in right shoulder: Secondary | ICD-10-CM | POA: Diagnosis not present

## 2018-09-15 DIAGNOSIS — W19XXXD Unspecified fall, subsequent encounter: Secondary | ICD-10-CM | POA: Diagnosis not present

## 2018-09-18 DIAGNOSIS — R296 Repeated falls: Secondary | ICD-10-CM | POA: Diagnosis not present

## 2018-09-18 DIAGNOSIS — I10 Essential (primary) hypertension: Secondary | ICD-10-CM | POA: Diagnosis not present

## 2018-09-18 DIAGNOSIS — S42201D Unspecified fracture of upper end of right humerus, subsequent encounter for fracture with routine healing: Secondary | ICD-10-CM | POA: Diagnosis not present

## 2018-09-18 DIAGNOSIS — I2510 Atherosclerotic heart disease of native coronary artery without angina pectoris: Secondary | ICD-10-CM | POA: Diagnosis not present

## 2018-09-20 ENCOUNTER — Ambulatory Visit: Payer: Medicare HMO | Admitting: Orthopedic Surgery

## 2018-09-20 ENCOUNTER — Ambulatory Visit (INDEPENDENT_AMBULATORY_CARE_PROVIDER_SITE_OTHER): Payer: Medicare HMO

## 2018-09-20 ENCOUNTER — Other Ambulatory Visit: Payer: Self-pay

## 2018-09-20 ENCOUNTER — Encounter: Payer: Self-pay | Admitting: Orthopedic Surgery

## 2018-09-20 VITALS — BP 169/78 | HR 72 | Temp 96.9°F | Ht 62.0 in

## 2018-09-20 DIAGNOSIS — S42294A Other nondisplaced fracture of upper end of right humerus, initial encounter for closed fracture: Secondary | ICD-10-CM

## 2018-09-20 NOTE — Patient Instructions (Signed)
Tylenol and Advil for comfort

## 2018-09-20 NOTE — Progress Notes (Signed)
  NEW PROBLEM OFFICE VISIT  Chief Complaint  Patient presents with  . Shoulder Injury    right proximal humerus fracture 09/10/18 fall     85 yo female c/o right shoulder pain after a fall:  Right shoulder:  Non radiating mild Pain  10 days duration She took the sling off and is taking tylenol and advil alternating Pain increses if she moves her arm     Review of Systems  Unable to perform ROS: Dementia     Past Medical History:  Diagnosis Date  . Chronic headache   . DDD (degenerative disc disease), cervical   . Generalized weakness   . Hypertension   . Hypoglycemia   . Pulmonary embolism ([place])   . Sinusitis     Past Surgical History:  Procedure Laterality Date  . APPENDECTOMY      History reviewed. No pertinent family history. Social History   Tobacco Use  . Smoking status: Never Smoker  . Smokeless tobacco: Never Used  Substance Use Topics  . Alcohol use: No  . Drug use: No    Allergies  Allergen Reactions  . Codeine Nausea And Vomiting    Current Meds  Medication Sig  . losartan (COZAAR) 100 MG tablet Take 100 mg by mouth every evening.     BP (!) 169/78   Pulse 72   Temp (!) 96.9 F (36.1 C)   Ht 5\' 2"  (1.575 m)   BMI 24.69 kg/m   Physical Exam Patient's development nutrition normal body habitus small frame grooming normal  Peripheral vascular system no swelling or varicose veins upper extremity normal pulse and temperature right and left upper extremity Ortho Exam  Right shoulder is tender to palpation mild swelling range of motion and stability assessment deferred because of pain and acuity of fracture muscle tone normal no atrophy  Skin normal  Coordination test could not be performed Reflex right arm deferred Sensory exam seems normal Oriented to person but not time or place Mood pleasant affect normal    MEDICAL DECISION SECTION  Xrays were done at [facility] initial film showed a nondisplaced proximal humerus  fracture  My independent reading of xrays:  Review of these x-rays show that the fracture was indeed meeting the criteria for nondisplaced fracture  X-ray today in the office shows varus alignment to the proximal head neck area with no major malalignment   Encounter Diagnosis  Name Primary?  . Other closed nondisplaced fracture of proximal end of right humerus, initial encounter Yes    PLAN: (Rx., injectx, surgery, frx, mri/ct) The patient is a candidate for physical therapy in about 2 weeks  X-ray in 2 weeks    No orders of the defined types were placed in this encounter.   Arther Abbott, MD  09/20/2018 11:43 AM

## 2018-09-21 DIAGNOSIS — I10 Essential (primary) hypertension: Secondary | ICD-10-CM | POA: Diagnosis not present

## 2018-09-21 DIAGNOSIS — R296 Repeated falls: Secondary | ICD-10-CM | POA: Diagnosis not present

## 2018-09-21 DIAGNOSIS — I2510 Atherosclerotic heart disease of native coronary artery without angina pectoris: Secondary | ICD-10-CM | POA: Diagnosis not present

## 2018-09-21 DIAGNOSIS — S42201D Unspecified fracture of upper end of right humerus, subsequent encounter for fracture with routine healing: Secondary | ICD-10-CM | POA: Diagnosis not present

## 2018-09-22 DIAGNOSIS — I10 Essential (primary) hypertension: Secondary | ICD-10-CM | POA: Diagnosis not present

## 2018-09-22 DIAGNOSIS — R296 Repeated falls: Secondary | ICD-10-CM | POA: Diagnosis not present

## 2018-09-22 DIAGNOSIS — S42201D Unspecified fracture of upper end of right humerus, subsequent encounter for fracture with routine healing: Secondary | ICD-10-CM | POA: Diagnosis not present

## 2018-09-22 DIAGNOSIS — I2510 Atherosclerotic heart disease of native coronary artery without angina pectoris: Secondary | ICD-10-CM | POA: Diagnosis not present

## 2018-09-27 DIAGNOSIS — L03115 Cellulitis of right lower limb: Secondary | ICD-10-CM | POA: Diagnosis not present

## 2018-09-27 DIAGNOSIS — R42 Dizziness and giddiness: Secondary | ICD-10-CM | POA: Diagnosis not present

## 2018-09-27 DIAGNOSIS — I2510 Atherosclerotic heart disease of native coronary artery without angina pectoris: Secondary | ICD-10-CM | POA: Diagnosis not present

## 2018-09-27 DIAGNOSIS — F411 Generalized anxiety disorder: Secondary | ICD-10-CM | POA: Diagnosis not present

## 2018-09-27 DIAGNOSIS — E559 Vitamin D deficiency, unspecified: Secondary | ICD-10-CM | POA: Diagnosis not present

## 2018-09-27 DIAGNOSIS — D513 Other dietary vitamin B12 deficiency anemia: Secondary | ICD-10-CM | POA: Diagnosis not present

## 2018-09-27 DIAGNOSIS — N390 Urinary tract infection, site not specified: Secondary | ICD-10-CM | POA: Diagnosis not present

## 2018-09-27 DIAGNOSIS — N218 Other lower urinary tract calculus: Secondary | ICD-10-CM | POA: Diagnosis not present

## 2018-09-27 DIAGNOSIS — Z1321 Encounter for screening for nutritional disorder: Secondary | ICD-10-CM | POA: Diagnosis not present

## 2018-09-27 DIAGNOSIS — I951 Orthostatic hypotension: Secondary | ICD-10-CM | POA: Diagnosis not present

## 2018-09-27 DIAGNOSIS — R600 Localized edema: Secondary | ICD-10-CM | POA: Diagnosis not present

## 2018-09-27 DIAGNOSIS — M25511 Pain in right shoulder: Secondary | ICD-10-CM | POA: Diagnosis not present

## 2018-09-27 DIAGNOSIS — I10 Essential (primary) hypertension: Secondary | ICD-10-CM | POA: Diagnosis not present

## 2018-09-27 DIAGNOSIS — R5383 Other fatigue: Secondary | ICD-10-CM | POA: Diagnosis not present

## 2018-09-27 DIAGNOSIS — S42101D Fracture of unspecified part of scapula, right shoulder, subsequent encounter for fracture with routine healing: Secondary | ICD-10-CM | POA: Diagnosis not present

## 2018-09-27 DIAGNOSIS — R296 Repeated falls: Secondary | ICD-10-CM | POA: Diagnosis not present

## 2018-09-27 DIAGNOSIS — S42201D Unspecified fracture of upper end of right humerus, subsequent encounter for fracture with routine healing: Secondary | ICD-10-CM | POA: Diagnosis not present

## 2018-09-27 DIAGNOSIS — I872 Venous insufficiency (chronic) (peripheral): Secondary | ICD-10-CM | POA: Diagnosis not present

## 2018-09-29 ENCOUNTER — Emergency Department (HOSPITAL_COMMUNITY)
Admission: EM | Admit: 2018-09-29 | Discharge: 2018-09-29 | Disposition: A | Discharge status: Home / Self Care | Payer: Medicare HMO

## 2018-09-29 DIAGNOSIS — Z7409 Other reduced mobility: Secondary | ICD-10-CM | POA: Diagnosis not present

## 2018-09-29 DIAGNOSIS — I10 Essential (primary) hypertension: Secondary | ICD-10-CM | POA: Diagnosis not present

## 2018-09-29 DIAGNOSIS — I2510 Atherosclerotic heart disease of native coronary artery without angina pectoris: Secondary | ICD-10-CM | POA: Diagnosis not present

## 2018-09-29 DIAGNOSIS — S42201D Unspecified fracture of upper end of right humerus, subsequent encounter for fracture with routine healing: Secondary | ICD-10-CM | POA: Diagnosis not present

## 2018-09-29 DIAGNOSIS — R296 Repeated falls: Secondary | ICD-10-CM | POA: Diagnosis not present

## 2018-09-29 DIAGNOSIS — S42101D Fracture of unspecified part of scapula, right shoulder, subsequent encounter for fracture with routine healing: Secondary | ICD-10-CM | POA: Diagnosis not present

## 2018-09-30 DIAGNOSIS — S42201A Unspecified fracture of upper end of right humerus, initial encounter for closed fracture: Secondary | ICD-10-CM | POA: Insufficient documentation

## 2018-10-04 ENCOUNTER — Encounter: Payer: Self-pay | Admitting: Orthopedic Surgery

## 2018-10-04 ENCOUNTER — Other Ambulatory Visit: Payer: Self-pay

## 2018-10-04 ENCOUNTER — Ambulatory Visit (INDEPENDENT_AMBULATORY_CARE_PROVIDER_SITE_OTHER): Payer: Medicare HMO

## 2018-10-04 ENCOUNTER — Ambulatory Visit (INDEPENDENT_AMBULATORY_CARE_PROVIDER_SITE_OTHER): Payer: Medicare HMO | Admitting: Orthopedic Surgery

## 2018-10-04 VITALS — BP 146/74 | HR 84 | Temp 98.6°F | Ht 62.0 in | Wt 135.0 lb

## 2018-10-04 DIAGNOSIS — S42294D Other nondisplaced fracture of upper end of right humerus, subsequent encounter for fracture with routine healing: Secondary | ICD-10-CM | POA: Diagnosis not present

## 2018-10-04 NOTE — Progress Notes (Signed)
Patient ID: [name], female   DOB: [birth year], 85 y.o.   MRN: [id number]  FRACTURE CARE   Chief Complaint  Patient presents with  . Shoulder Injury    Rt shoulder injury   doi 6/5    poid # 25   Encounter Diagnosis  Name Primary?  . Other closed nondisplaced fracture of proximal end of right humerus with routine healing, subsequent encounter 09/10/18 Yes    CURRENT TREATMENT : Shoulder immobilization POST INJURY DAY: 25  GLOBAL PERIOD DAY 14/90  X-ray shows fracture healing in good position  Recommend 8 weeks of PT follow-up for clinical exam

## 2018-10-05 DIAGNOSIS — I2510 Atherosclerotic heart disease of native coronary artery without angina pectoris: Secondary | ICD-10-CM | POA: Diagnosis not present

## 2018-10-05 DIAGNOSIS — R296 Repeated falls: Secondary | ICD-10-CM | POA: Diagnosis not present

## 2018-10-05 DIAGNOSIS — S42201D Unspecified fracture of upper end of right humerus, subsequent encounter for fracture with routine healing: Secondary | ICD-10-CM | POA: Diagnosis not present

## 2018-10-05 DIAGNOSIS — I10 Essential (primary) hypertension: Secondary | ICD-10-CM | POA: Diagnosis not present

## 2018-10-06 DIAGNOSIS — W19XXXD Unspecified fall, subsequent encounter: Secondary | ICD-10-CM | POA: Diagnosis not present

## 2018-10-06 DIAGNOSIS — M25511 Pain in right shoulder: Secondary | ICD-10-CM | POA: Diagnosis not present

## 2018-10-06 DIAGNOSIS — Z23 Encounter for immunization: Secondary | ICD-10-CM | POA: Diagnosis not present

## 2018-10-07 DIAGNOSIS — S42201D Unspecified fracture of upper end of right humerus, subsequent encounter for fracture with routine healing: Secondary | ICD-10-CM | POA: Diagnosis not present

## 2018-10-07 DIAGNOSIS — I10 Essential (primary) hypertension: Secondary | ICD-10-CM | POA: Diagnosis not present

## 2018-10-07 DIAGNOSIS — I2510 Atherosclerotic heart disease of native coronary artery without angina pectoris: Secondary | ICD-10-CM | POA: Diagnosis not present

## 2018-10-07 DIAGNOSIS — R296 Repeated falls: Secondary | ICD-10-CM | POA: Diagnosis not present

## 2018-10-15 DIAGNOSIS — S42201D Unspecified fracture of upper end of right humerus, subsequent encounter for fracture with routine healing: Secondary | ICD-10-CM | POA: Diagnosis not present

## 2018-10-15 DIAGNOSIS — R296 Repeated falls: Secondary | ICD-10-CM | POA: Diagnosis not present

## 2018-10-15 DIAGNOSIS — I10 Essential (primary) hypertension: Secondary | ICD-10-CM | POA: Diagnosis not present

## 2018-10-15 DIAGNOSIS — I2510 Atherosclerotic heart disease of native coronary artery without angina pectoris: Secondary | ICD-10-CM | POA: Diagnosis not present

## 2018-10-29 DIAGNOSIS — S42201D Unspecified fracture of upper end of right humerus, subsequent encounter for fracture with routine healing: Secondary | ICD-10-CM | POA: Diagnosis not present

## 2018-10-29 DIAGNOSIS — R296 Repeated falls: Secondary | ICD-10-CM | POA: Diagnosis not present

## 2018-10-29 DIAGNOSIS — I10 Essential (primary) hypertension: Secondary | ICD-10-CM | POA: Diagnosis not present

## 2018-10-29 DIAGNOSIS — I2510 Atherosclerotic heart disease of native coronary artery without angina pectoris: Secondary | ICD-10-CM | POA: Diagnosis not present

## 2018-11-06 DIAGNOSIS — W19XXXD Unspecified fall, subsequent encounter: Secondary | ICD-10-CM | POA: Diagnosis not present

## 2018-11-06 DIAGNOSIS — M25511 Pain in right shoulder: Secondary | ICD-10-CM | POA: Diagnosis not present

## 2018-11-09 DIAGNOSIS — F411 Generalized anxiety disorder: Secondary | ICD-10-CM | POA: Diagnosis not present

## 2018-11-09 DIAGNOSIS — I872 Venous insufficiency (chronic) (peripheral): Secondary | ICD-10-CM | POA: Diagnosis not present

## 2018-11-09 DIAGNOSIS — R42 Dizziness and giddiness: Secondary | ICD-10-CM | POA: Diagnosis not present

## 2018-11-09 DIAGNOSIS — R600 Localized edema: Secondary | ICD-10-CM | POA: Diagnosis not present

## 2018-11-09 DIAGNOSIS — D513 Other dietary vitamin B12 deficiency anemia: Secondary | ICD-10-CM | POA: Diagnosis not present

## 2018-11-09 DIAGNOSIS — I951 Orthostatic hypotension: Secondary | ICD-10-CM | POA: Diagnosis not present

## 2018-11-09 DIAGNOSIS — I10 Essential (primary) hypertension: Secondary | ICD-10-CM | POA: Diagnosis not present

## 2018-12-01 ENCOUNTER — Ambulatory Visit: Payer: Medicare HMO | Admitting: Orthopedic Surgery

## 2018-12-07 DIAGNOSIS — M25511 Pain in right shoulder: Secondary | ICD-10-CM | POA: Diagnosis not present

## 2018-12-07 DIAGNOSIS — W19XXXD Unspecified fall, subsequent encounter: Secondary | ICD-10-CM | POA: Diagnosis not present

## 2018-12-07 DIAGNOSIS — Z23 Encounter for immunization: Secondary | ICD-10-CM | POA: Diagnosis not present

## 2018-12-22 ENCOUNTER — Telehealth: Payer: Self-pay | Admitting: Orthopedic Surgery

## 2018-12-22 ENCOUNTER — Ambulatory Visit: Payer: Medicare HMO | Admitting: Orthopedic Surgery

## 2018-12-22 DIAGNOSIS — R42 Dizziness and giddiness: Secondary | ICD-10-CM | POA: Diagnosis not present

## 2018-12-22 DIAGNOSIS — D513 Other dietary vitamin B12 deficiency anemia: Secondary | ICD-10-CM | POA: Diagnosis not present

## 2018-12-22 DIAGNOSIS — I10 Essential (primary) hypertension: Secondary | ICD-10-CM | POA: Diagnosis not present

## 2018-12-22 DIAGNOSIS — I872 Venous insufficiency (chronic) (peripheral): Secondary | ICD-10-CM | POA: Diagnosis not present

## 2018-12-22 DIAGNOSIS — I951 Orthostatic hypotension: Secondary | ICD-10-CM | POA: Diagnosis not present

## 2018-12-22 DIAGNOSIS — F411 Generalized anxiety disorder: Secondary | ICD-10-CM | POA: Diagnosis not present

## 2018-12-22 DIAGNOSIS — R600 Localized edema: Secondary | ICD-10-CM | POA: Diagnosis not present

## 2018-12-22 NOTE — Telephone Encounter (Signed)
Patient's son [name] called and stated his mother didnt want to come in.  She stated she is doing fine.

## 2019-01-06 DIAGNOSIS — M25511 Pain in right shoulder: Secondary | ICD-10-CM | POA: Diagnosis not present

## 2019-01-06 DIAGNOSIS — W19XXXD Unspecified fall, subsequent encounter: Secondary | ICD-10-CM | POA: Diagnosis not present

## 2019-01-24 DIAGNOSIS — R42 Dizziness and giddiness: Secondary | ICD-10-CM | POA: Diagnosis not present

## 2019-01-24 DIAGNOSIS — F411 Generalized anxiety disorder: Secondary | ICD-10-CM | POA: Diagnosis not present

## 2019-01-24 DIAGNOSIS — I10 Essential (primary) hypertension: Secondary | ICD-10-CM | POA: Diagnosis not present

## 2019-01-24 DIAGNOSIS — I872 Venous insufficiency (chronic) (peripheral): Secondary | ICD-10-CM | POA: Diagnosis not present

## 2019-01-24 DIAGNOSIS — I951 Orthostatic hypotension: Secondary | ICD-10-CM | POA: Diagnosis not present

## 2019-01-24 DIAGNOSIS — R600 Localized edema: Secondary | ICD-10-CM | POA: Diagnosis not present

## 2019-01-24 DIAGNOSIS — D513 Other dietary vitamin B12 deficiency anemia: Secondary | ICD-10-CM | POA: Diagnosis not present

## 2019-02-06 DIAGNOSIS — M25511 Pain in right shoulder: Secondary | ICD-10-CM | POA: Diagnosis not present

## 2019-02-06 DIAGNOSIS — W19XXXD Unspecified fall, subsequent encounter: Secondary | ICD-10-CM | POA: Diagnosis not present

## 2019-03-08 DIAGNOSIS — W19XXXD Unspecified fall, subsequent encounter: Secondary | ICD-10-CM | POA: Diagnosis not present

## 2019-03-08 DIAGNOSIS — M25511 Pain in right shoulder: Secondary | ICD-10-CM | POA: Diagnosis not present

## 2019-03-22 DIAGNOSIS — I872 Venous insufficiency (chronic) (peripheral): Secondary | ICD-10-CM | POA: Diagnosis not present

## 2019-03-22 DIAGNOSIS — I2510 Atherosclerotic heart disease of native coronary artery without angina pectoris: Secondary | ICD-10-CM | POA: Diagnosis not present

## 2019-03-22 DIAGNOSIS — I951 Orthostatic hypotension: Secondary | ICD-10-CM | POA: Diagnosis not present

## 2019-03-22 DIAGNOSIS — R42 Dizziness and giddiness: Secondary | ICD-10-CM | POA: Diagnosis not present

## 2019-03-22 DIAGNOSIS — D513 Other dietary vitamin B12 deficiency anemia: Secondary | ICD-10-CM | POA: Diagnosis not present

## 2019-03-22 DIAGNOSIS — F411 Generalized anxiety disorder: Secondary | ICD-10-CM | POA: Diagnosis not present

## 2019-03-22 DIAGNOSIS — R600 Localized edema: Secondary | ICD-10-CM | POA: Diagnosis not present

## 2019-03-22 DIAGNOSIS — I10 Essential (primary) hypertension: Secondary | ICD-10-CM | POA: Diagnosis not present

## 2019-04-08 DIAGNOSIS — M25511 Pain in right shoulder: Secondary | ICD-10-CM | POA: Diagnosis not present

## 2019-04-08 DIAGNOSIS — W19XXXD Unspecified fall, subsequent encounter: Secondary | ICD-10-CM | POA: Diagnosis not present

## 2019-04-19 DIAGNOSIS — F411 Generalized anxiety disorder: Secondary | ICD-10-CM | POA: Diagnosis not present

## 2019-04-19 DIAGNOSIS — R42 Dizziness and giddiness: Secondary | ICD-10-CM | POA: Diagnosis not present

## 2019-04-19 DIAGNOSIS — R600 Localized edema: Secondary | ICD-10-CM | POA: Diagnosis not present

## 2019-04-19 DIAGNOSIS — I951 Orthostatic hypotension: Secondary | ICD-10-CM | POA: Diagnosis not present

## 2019-04-19 DIAGNOSIS — D513 Other dietary vitamin B12 deficiency anemia: Secondary | ICD-10-CM | POA: Diagnosis not present

## 2019-04-19 DIAGNOSIS — I872 Venous insufficiency (chronic) (peripheral): Secondary | ICD-10-CM | POA: Diagnosis not present

## 2019-04-19 DIAGNOSIS — I2510 Atherosclerotic heart disease of native coronary artery without angina pectoris: Secondary | ICD-10-CM | POA: Diagnosis not present

## 2019-04-19 DIAGNOSIS — I10 Essential (primary) hypertension: Secondary | ICD-10-CM | POA: Diagnosis not present

## 2019-05-09 DIAGNOSIS — W19XXXD Unspecified fall, subsequent encounter: Secondary | ICD-10-CM | POA: Diagnosis not present

## 2019-05-09 DIAGNOSIS — M25511 Pain in right shoulder: Secondary | ICD-10-CM | POA: Diagnosis not present

## 2019-05-17 DIAGNOSIS — I10 Essential (primary) hypertension: Secondary | ICD-10-CM | POA: Diagnosis not present

## 2019-05-17 DIAGNOSIS — I951 Orthostatic hypotension: Secondary | ICD-10-CM | POA: Diagnosis not present

## 2019-05-17 DIAGNOSIS — R600 Localized edema: Secondary | ICD-10-CM | POA: Diagnosis not present

## 2019-05-17 DIAGNOSIS — I2510 Atherosclerotic heart disease of native coronary artery without angina pectoris: Secondary | ICD-10-CM | POA: Diagnosis not present

## 2019-05-17 DIAGNOSIS — F411 Generalized anxiety disorder: Secondary | ICD-10-CM | POA: Diagnosis not present

## 2019-05-17 DIAGNOSIS — I872 Venous insufficiency (chronic) (peripheral): Secondary | ICD-10-CM | POA: Diagnosis not present

## 2019-05-17 DIAGNOSIS — D513 Other dietary vitamin B12 deficiency anemia: Secondary | ICD-10-CM | POA: Diagnosis not present

## 2019-05-17 DIAGNOSIS — R42 Dizziness and giddiness: Secondary | ICD-10-CM | POA: Diagnosis not present

## 2019-06-06 DIAGNOSIS — W19XXXD Unspecified fall, subsequent encounter: Secondary | ICD-10-CM | POA: Diagnosis not present

## 2019-06-06 DIAGNOSIS — M25511 Pain in right shoulder: Secondary | ICD-10-CM | POA: Diagnosis not present

## 2019-06-14 DIAGNOSIS — R600 Localized edema: Secondary | ICD-10-CM | POA: Diagnosis not present

## 2019-06-14 DIAGNOSIS — I951 Orthostatic hypotension: Secondary | ICD-10-CM | POA: Diagnosis not present

## 2019-06-14 DIAGNOSIS — R42 Dizziness and giddiness: Secondary | ICD-10-CM | POA: Diagnosis not present

## 2019-06-14 DIAGNOSIS — D513 Other dietary vitamin B12 deficiency anemia: Secondary | ICD-10-CM | POA: Diagnosis not present

## 2019-06-14 DIAGNOSIS — F411 Generalized anxiety disorder: Secondary | ICD-10-CM | POA: Diagnosis not present

## 2019-06-14 DIAGNOSIS — I872 Venous insufficiency (chronic) (peripheral): Secondary | ICD-10-CM | POA: Diagnosis not present

## 2019-06-14 DIAGNOSIS — I10 Essential (primary) hypertension: Secondary | ICD-10-CM | POA: Diagnosis not present

## 2019-07-07 DIAGNOSIS — W19XXXD Unspecified fall, subsequent encounter: Secondary | ICD-10-CM | POA: Diagnosis not present

## 2019-07-07 DIAGNOSIS — M25511 Pain in right shoulder: Secondary | ICD-10-CM | POA: Diagnosis not present

## 2019-07-19 DIAGNOSIS — R42 Dizziness and giddiness: Secondary | ICD-10-CM | POA: Diagnosis not present

## 2019-07-19 DIAGNOSIS — F411 Generalized anxiety disorder: Secondary | ICD-10-CM | POA: Diagnosis not present

## 2019-07-19 DIAGNOSIS — D513 Other dietary vitamin B12 deficiency anemia: Secondary | ICD-10-CM | POA: Diagnosis not present

## 2019-07-19 DIAGNOSIS — I951 Orthostatic hypotension: Secondary | ICD-10-CM | POA: Diagnosis not present

## 2019-07-19 DIAGNOSIS — R600 Localized edema: Secondary | ICD-10-CM | POA: Diagnosis not present

## 2019-07-19 DIAGNOSIS — I10 Essential (primary) hypertension: Secondary | ICD-10-CM | POA: Diagnosis not present

## 2019-07-19 DIAGNOSIS — I872 Venous insufficiency (chronic) (peripheral): Secondary | ICD-10-CM | POA: Diagnosis not present

## 2019-08-06 DIAGNOSIS — M25511 Pain in right shoulder: Secondary | ICD-10-CM | POA: Diagnosis not present

## 2019-08-11 DIAGNOSIS — R600 Localized edema: Secondary | ICD-10-CM | POA: Diagnosis not present

## 2019-08-11 DIAGNOSIS — R42 Dizziness and giddiness: Secondary | ICD-10-CM | POA: Diagnosis not present

## 2019-08-11 DIAGNOSIS — F411 Generalized anxiety disorder: Secondary | ICD-10-CM | POA: Diagnosis not present

## 2019-08-11 DIAGNOSIS — I872 Venous insufficiency (chronic) (peripheral): Secondary | ICD-10-CM | POA: Diagnosis not present

## 2019-08-11 DIAGNOSIS — I10 Essential (primary) hypertension: Secondary | ICD-10-CM | POA: Diagnosis not present

## 2019-08-11 DIAGNOSIS — D513 Other dietary vitamin B12 deficiency anemia: Secondary | ICD-10-CM | POA: Diagnosis not present

## 2019-08-11 DIAGNOSIS — I951 Orthostatic hypotension: Secondary | ICD-10-CM | POA: Diagnosis not present

## 2019-08-29 ENCOUNTER — Ambulatory Visit: Payer: Medicare HMO | Admitting: Podiatry

## 2019-09-06 DIAGNOSIS — M25511 Pain in right shoulder: Secondary | ICD-10-CM | POA: Diagnosis not present

## 2019-09-07 DIAGNOSIS — R600 Localized edema: Secondary | ICD-10-CM | POA: Diagnosis not present

## 2019-09-07 DIAGNOSIS — M25551 Pain in right hip: Secondary | ICD-10-CM | POA: Diagnosis not present

## 2019-09-19 ENCOUNTER — Emergency Department (HOSPITAL_COMMUNITY): Payer: Medicare HMO

## 2019-09-19 ENCOUNTER — Other Ambulatory Visit: Payer: Self-pay

## 2019-09-19 ENCOUNTER — Inpatient Hospital Stay (HOSPITAL_COMMUNITY)
Admission: EM | Admit: 2019-09-19 | Discharge: 2019-09-22 | DRG: 920 | Disposition: A | Discharge status: Hospice | Payer: Medicare HMO | Attending: Internal Medicine | Admitting: Internal Medicine

## 2019-09-19 ENCOUNTER — Encounter (HOSPITAL_COMMUNITY): Payer: Self-pay | Admitting: Primary Care

## 2019-09-19 DIAGNOSIS — R627 Adult failure to thrive: Secondary | ICD-10-CM | POA: Diagnosis present

## 2019-09-19 DIAGNOSIS — M719 Bursopathy, unspecified: Secondary | ICD-10-CM | POA: Diagnosis present

## 2019-09-19 DIAGNOSIS — F0280 Dementia in other diseases classified elsewhere without behavioral disturbance: Secondary | ICD-10-CM | POA: Diagnosis not present

## 2019-09-19 DIAGNOSIS — R531 Weakness: Secondary | ICD-10-CM | POA: Diagnosis not present

## 2019-09-19 DIAGNOSIS — Z7189 Other specified counseling: Secondary | ICD-10-CM | POA: Insufficient documentation

## 2019-09-19 DIAGNOSIS — I10 Essential (primary) hypertension: Secondary | ICD-10-CM | POA: Diagnosis present

## 2019-09-19 DIAGNOSIS — Y848 Other medical procedures as the cause of abnormal reaction of the patient, or of later complication, without mention of misadventure at the time of the procedure: Secondary | ICD-10-CM | POA: Diagnosis present

## 2019-09-19 DIAGNOSIS — Z6827 Body mass index (BMI) 27.0-27.9, adult: Secondary | ICD-10-CM | POA: Diagnosis not present

## 2019-09-19 DIAGNOSIS — Z20822 Contact with and (suspected) exposure to covid-19: Secondary | ICD-10-CM | POA: Diagnosis not present

## 2019-09-19 DIAGNOSIS — R262 Difficulty in walking, not elsewhere classified: Secondary | ICD-10-CM | POA: Diagnosis present

## 2019-09-19 DIAGNOSIS — F05 Delirium due to known physiological condition: Secondary | ICD-10-CM | POA: Diagnosis present

## 2019-09-19 DIAGNOSIS — Z86711 Personal history of pulmonary embolism: Secondary | ICD-10-CM | POA: Diagnosis not present

## 2019-09-19 DIAGNOSIS — M96840 Postprocedural hematoma of a musculoskeletal structure following a musculoskeletal system procedure: Principal | ICD-10-CM | POA: Diagnosis present

## 2019-09-19 DIAGNOSIS — S7012XA Contusion of left thigh, initial encounter: Secondary | ICD-10-CM | POA: Diagnosis present

## 2019-09-19 DIAGNOSIS — M25551 Pain in right hip: Secondary | ICD-10-CM | POA: Diagnosis not present

## 2019-09-19 DIAGNOSIS — S7011XA Contusion of right thigh, initial encounter: Secondary | ICD-10-CM | POA: Diagnosis present

## 2019-09-19 DIAGNOSIS — F0390 Unspecified dementia without behavioral disturbance: Secondary | ICD-10-CM | POA: Diagnosis not present

## 2019-09-19 DIAGNOSIS — Z66 Do not resuscitate: Secondary | ICD-10-CM | POA: Diagnosis not present

## 2019-09-19 DIAGNOSIS — Z791 Long term (current) use of non-steroidal anti-inflammatories (NSAID): Secondary | ICD-10-CM | POA: Diagnosis not present

## 2019-09-19 DIAGNOSIS — F0391 Unspecified dementia with behavioral disturbance: Secondary | ICD-10-CM | POA: Diagnosis not present

## 2019-09-19 DIAGNOSIS — Z515 Encounter for palliative care: Secondary | ICD-10-CM | POA: Diagnosis not present

## 2019-09-19 DIAGNOSIS — S7010XA Contusion of unspecified thigh, initial encounter: Secondary | ICD-10-CM | POA: Diagnosis present

## 2019-09-19 DIAGNOSIS — I2699 Other pulmonary embolism without acute cor pulmonale: Secondary | ICD-10-CM

## 2019-09-19 DIAGNOSIS — Z9181 History of falling: Secondary | ICD-10-CM | POA: Diagnosis not present

## 2019-09-19 DIAGNOSIS — M25559 Pain in unspecified hip: Secondary | ICD-10-CM | POA: Diagnosis not present

## 2019-09-19 DIAGNOSIS — S7011XD Contusion of right thigh, subsequent encounter: Secondary | ICD-10-CM | POA: Diagnosis not present

## 2019-09-19 DIAGNOSIS — Z993 Dependence on wheelchair: Secondary | ICD-10-CM | POA: Diagnosis not present

## 2019-09-19 HISTORY — DX: Unspecified dementia, unspecified severity, without behavioral disturbance, psychotic disturbance, mood disturbance, and anxiety: F03.90

## 2019-09-19 LAB — CBC WITH DIFFERENTIAL/PLATELET
Abs Immature Granulocytes: 0.02 10*3/uL (ref 0.00–0.07)
Basophils Absolute: 0 10*3/uL (ref 0.0–0.1)
Basophils Relative: 0 %
Eosinophils Absolute: 0.1 10*3/uL (ref 0.0–0.5)
Eosinophils Relative: 1 %
HCT: 43 % (ref 36.0–46.0)
Hemoglobin: 14.3 g/dL (ref 12.0–15.0)
Immature Granulocytes: 0 %
Lymphocytes Relative: 17 %
Lymphs Abs: 1.6 10*3/uL (ref 0.7–4.0)
MCH: 32.6 pg (ref 26.0–34.0)
MCHC: 33.3 g/dL (ref 30.0–36.0)
MCV: 97.9 fL (ref 80.0–100.0)
Monocytes Absolute: 0.7 10*3/uL (ref 0.1–1.0)
Monocytes Relative: 7 %
Neutro Abs: 7.1 10*3/uL (ref 1.7–7.7)
Neutrophils Relative %: 75 %
Platelets: 141 10*3/uL — ABNORMAL LOW (ref 150–400)
RBC: 4.39 MIL/uL (ref 3.87–5.11)
RDW: 13.8 % (ref 11.5–15.5)
WBC: 9.4 10*3/uL (ref 4.0–10.5)
nRBC: 0 % (ref 0.0–0.2)

## 2019-09-19 LAB — URINALYSIS, ROUTINE W REFLEX MICROSCOPIC
Bilirubin Urine: NEGATIVE
Glucose, UA: NEGATIVE mg/dL
Hgb urine dipstick: NEGATIVE
Ketones, ur: NEGATIVE mg/dL
Nitrite: NEGATIVE
Protein, ur: NEGATIVE mg/dL
Specific Gravity, Urine: 1.016 (ref 1.005–1.030)
pH: 6 (ref 5.0–8.0)

## 2019-09-19 LAB — COMPREHENSIVE METABOLIC PANEL
ALT: 23 U/L (ref 0–44)
AST: 22 U/L (ref 15–41)
Albumin: 3.6 g/dL (ref 3.5–5.0)
Alkaline Phosphatase: 62 U/L (ref 38–126)
Anion gap: 8 (ref 5–15)
BUN: 29 mg/dL — ABNORMAL HIGH (ref 8–23)
CO2: 27 mmol/L (ref 22–32)
Calcium: 8.5 mg/dL — ABNORMAL LOW (ref 8.9–10.3)
Chloride: 99 mmol/L (ref 98–111)
Creatinine, Ser: 0.88 mg/dL (ref 0.44–1.00)
GFR calc Af Amer: >60 mL/min (ref >60)
GFR calc non Af Amer: 55 mL/min — ABNORMAL LOW (ref >60)
Glucose, Bld: 112 mg/dL — ABNORMAL HIGH (ref 70–99)
Potassium: 4.2 mmol/L (ref 3.5–5.1)
Sodium: 134 mmol/L — ABNORMAL LOW (ref 135–145)
Total Bilirubin: 1.1 mg/dL (ref 0.3–1.2)
Total Protein: 6.4 g/dL — ABNORMAL LOW (ref 6.5–8.1)

## 2019-09-19 LAB — C-REACTIVE PROTEIN: CRP: 0.5 mg/dL (ref <1.0)

## 2019-09-19 LAB — SEDIMENTATION RATE: Sed Rate: 1 mm/hr (ref 0–22)

## 2019-09-19 LAB — SARS CORONAVIRUS 2 BY RT PCR (HOSPITAL ORDER, PERFORMED IN ~~LOC~~ HOSPITAL LAB): SARS Coronavirus 2: NEGATIVE

## 2019-09-19 MED ORDER — BISACODYL 10 MG RE SUPP: 10.0000 mg | Freq: Every day | RECTAL | Status: DC | PRN

## 2019-09-19 MED ORDER — ACETAMINOPHEN 650 MG RE SUPP: 650.0000 mg | Freq: Four times a day (QID) | RECTAL | Status: DC | PRN

## 2019-09-19 MED ORDER — SERTRALINE HCL 25 MG PO TABS
12.5000 mg | ORAL_TABLET | Freq: Every day | ORAL | Status: DC
  Administered 2019-09-20 – 2019-09-22 (×3): 12.5 mg via ORAL
  Filled 2019-09-19 (×4): qty 0.5

## 2019-09-19 MED ORDER — ACETAMINOPHEN 325 MG PO TABS
650.0000 mg | ORAL_TABLET | Freq: Four times a day (QID) | ORAL | Status: DC | PRN
  Administered 2019-09-19 – 2019-09-22 (×3): 650 mg via ORAL
  Filled 2019-09-19 (×3): qty 2

## 2019-09-19 MED ORDER — POLYETHYLENE GLYCOL 3350 17 G PO PACK: 17.0000 g | PACK | Freq: Every day | ORAL | Status: DC | PRN

## 2019-09-19 MED ORDER — TRAMADOL HCL 50 MG PO TABS
50.0000 mg | ORAL_TABLET | Freq: Once | ORAL | Status: AC
  Administered 2019-09-19: 50 mg via ORAL
  Filled 2019-09-19: qty 1

## 2019-09-19 MED ORDER — DEXAMETHASONE SODIUM PHOSPHATE 10 MG/ML IJ SOLN
10.0000 mg | Freq: Once | INTRAMUSCULAR | Status: AC
  Administered 2019-09-19: 10 mg via INTRAMUSCULAR
  Filled 2019-09-19: qty 1

## 2019-09-19 MED ORDER — LORAZEPAM 2 MG/ML IJ SOLN
0.5000 mg | INTRAMUSCULAR | Status: AC | PRN
  Administered 2019-09-19 – 2019-09-20 (×2): 0.5 mg via INTRAVENOUS
  Filled 2019-09-19 (×2): qty 1

## 2019-09-19 MED ORDER — LOSARTAN POTASSIUM 50 MG PO TABS
50.0000 mg | ORAL_TABLET | Freq: Every morning | ORAL | Status: DC
  Administered 2019-09-20 – 2019-09-22 (×3): 50 mg via ORAL
  Filled 2019-09-19 (×3): qty 1

## 2019-09-19 MED ORDER — FLEET ENEMA 7-19 GM/118ML RE ENEM: 1.0000 | ENEMA | Freq: Once | RECTAL | Status: DC | PRN

## 2019-09-19 MED ORDER — SODIUM CHLORIDE 0.9 % IV SOLN: INTRAVENOUS | Status: AC

## 2019-09-19 MED ORDER — HALOPERIDOL LACTATE 5 MG/ML IJ SOLN
1.0000 mg | Freq: Four times a day (QID) | INTRAMUSCULAR | Status: DC | PRN
  Administered 2019-09-19 – 2019-09-21 (×3): 1 mg via INTRAMUSCULAR
  Filled 2019-09-19 (×3): qty 1

## 2019-09-19 NOTE — Consult Note (Signed)
Consultation Note Date: 09/19/2019   Patient Name: [name]  DOB: 06/11/1919  MRN: [id number]  Age / Sex: 86 y.o., female  PCP: Celene Squibb, MD Referring Physician: Varney Biles, MD  Reason for Consultation: Establishing goals of care and Psychosocial/spiritual support  HPI/Patient Profile: 86 y.o. female  with past medical history of memory loss, chronic headache, cervical direct degenerative disc disease, hypertension, history of PE, sinusitis, hypoglycemia, closed right proximal humerus fracture June 2020, lives at home with son, uses a walker admitted on 09/19/2019 with right hip pain without trauma, CT shows distal iliopsoas and medial thigh muscular swelling with probable hematoma at the iliopsoas tendon which favor strain over myositis.   Per notes, son wants her admitted for physical therapy and rehab stating that he cannot take her home if she is unable to walk.  Clinical Assessment and Goals of Care: I have reviewed medical records including EPIC notes, labs and imaging, received report from bedside nursing staff, examined the patient.    Call to son, Daisia Slomski,  to discuss diagnosis prognosis, [place], EOL wishes, disposition and options.  I introduced Palliative Medicine as specialized medical care for people living with serious illness. It focuses on providing relief from the symptoms and stress of a serious illness.   We discussed a brief life review of the patient.  Mrs. Ballard has been a widow since 48.  She has 5 sons and 2 daughters, one daughter died a year before Mrs. Schnitzler's husband.  She lives in [place] home, but is somewhat independent with ADLs.  She is able to give herself a sponge bath with set up, but [name] has to help her with dressing sometimes.  He shares that they have a walk-in shower but she because of modesty declines to use it.  As far as functional and nutritional  status, Mrs. Asfour has struggled with "weak legs" for quiet some time.  [name] shares that she fell in June and fractured her arm.  He tells me that she has been sleeping sitting up in the chair since then, and this is causing leg swelling.  He also shares that she does not like to lie down in the bed and sleep because she tells him that she feels "lonely".  [name] describes gradual declines for several years.   We discussed her current illness and what it means in the larger context of her on-going co-morbidities.  Natural disease trajectory and expectations at EOL were discussed.  We briefly talked about the chronic illness pathway, what is normal and expected  I attempted to elicit values and goals of care important to the patient.  We talked about short-term rehab and long-term care.  [name] tells me that he thinks it would be hard for her to go to rehab, although ED provider note states that [name] is requesting that she be placed in rehab.   Advanced directives, concepts specific to code status, were considered and discussed.  See below.  Questions and concerns were addressed.  The family was  encouraged to call with questions or concerns.   Conference with attending, bedside nursing staff, [facility] team related to patient condition, needs. PT eval/treat order made.  HCPOA   NEXT OF KIN - Onalee Hua is main caregiver, Management consultant. Mrs. Cusic has 5 sons, and 1 daughter.     SUMMARY OF RECOMMENDATIONS   At this point full scope/full code Continue to treat the treatable Agreeable to PT eval, possible short-term rehab   Code Status/Advance Care Planning:  Full code -Onalee Hua states that his father chose DNR status, but they have not considered these choices for Mrs. Eads.  Onalee Hua shares that if there is something that she can recover from, such as a heart attack, they would consider life support.  I share that with Mrs. Favaro's advancing age, progressing dementia, decreasing functional status and  decline over the past several years (by David's account) the medical team would recommend DNR status.  We talked about "treat the treatable but allowing natural death".  Symptom Management:   Per hospitalist, no additional needs at this time.  Palliative Prophylaxis:   Frequent Pain Assessment and Turn Reposition  Additional Recommendations (Limitations, Scope, Preferences):  Full Scope Treatment  Psycho-social/Spiritual:   Desire for further Chaplaincy support:no  Additional Recommendations: Caregiving  Support/Resources and Education on Hospice  Prognosis:   Unable to determine, based on outcomes.  Somewhat limited chronic illness burden  Discharge Planning: To be determined, based on outcomes.      Primary Diagnoses: Present on Admission: **None**   I have reviewed the medical record, interviewed the patient and family, and examined the patient. The following aspects are pertinent.  Past Medical History:  Diagnosis Date  . Chronic headache   . DDD (degenerative disc disease), cervical   . Generalized weakness   . Hypertension   . Hypoglycemia   . Pulmonary embolism (HCC)   . Sinusitis    Social History   Socioeconomic History  . Marital status: Widowed    Spouse name: Not on file  . Number of children: Not on file  . Years of education: Not on file  . Highest education level: Not on file  Occupational History  . Not on file  Tobacco Use  . Smoking status: Never Smoker  . Smokeless tobacco: Never Used  Vaping Use  . Vaping Use: Never used  Substance and Sexual Activity  . Alcohol use: No  . Drug use: No  . Sexual activity: Yes    Birth control/protection: Post-menopausal  Other Topics Concern  . Not on file  Social History Narrative  . Not on file   Social Determinants of Health   Financial Resource Strain:   . Difficulty of Paying Living Expenses:   Food Insecurity:   . Worried About Programme researcher, broadcasting/film/video in the Last Year:   . Engineer, site in the Last Year:   Transportation Needs:   . Freight forwarder (Medical):   Marland Kitchen Lack of Transportation (Non-Medical):   Physical Activity:   . Days of Exercise per Week:   . Minutes of Exercise per Session:   Stress:   . Feeling of Stress :   Social Connections:   . Frequency of Communication with Friends and Family:   . Frequency of Social Gatherings with Friends and Family:   . Attends Religious Services:   . Active Member of Clubs or Organizations:   . Attends Banker Meetings:   Marland Kitchen Marital Status:    History reviewed. No pertinent family  history. Scheduled Meds: Continuous Infusions: PRN Meds:. Medications Prior to Admission:  Prior to Admission medications   Medication Sig Start Date End Date Taking? Authorizing Provider  Cholecalciferol (VITAMIN D3) 250 MCG (10000 UT) TABS Take 1 tablet by mouth daily.   Yes [provider]  losartan (COZAAR) 50 MG tablet Take 50 mg by mouth every morning. 08/17/19  Yes [provider]  sertraline (ZOLOFT) 25 MG tablet Take 12.5 mg by mouth daily.  09/27/18  Yes [provider]   Allergies  Allergen Reactions  . Codeine Nausea And Vomiting   Review of Systems  Unable to perform ROS: Age    Physical Exam Vitals and nursing note reviewed.     Vital Signs: BP (!) 110/59   Pulse (!) 101   Temp 98.6 F (37 C) (Oral)   Resp 18   Ht 5\' 1"  (1.549 m)   Wt 65.8 kg   SpO2 95%   BMI 27.40 kg/m  Pain Scale: PAINAD       SpO2: SpO2: 95 % O2 Device:SpO2: 95 % O2 Flow Rate: .   IO: Intake/output summary: No intake or output data in the 24 hours ending 09/19/19 1512  LBM:   Baseline Weight: Weight: 65.8 kg Most recent weight: Weight: 65.8 kg     Palliative Assessment/Data:   Flowsheet Rows     Most Recent Value  Intake Tab  Referral Department Critical care  Unit at Time of Referral ER  Palliative Care Primary Diagnosis Other (Comment)  [Inability to ambulate -new]  Date  Notified 09/19/19  Palliative Care Type New Palliative care  Reason for referral Other (Comment)  Date of Admission 09/19/19  Date first seen by Palliative Care 09/19/19  # of days Palliative referral response time 0 Day(s)  # of days IP prior to Palliative referral 0  Clinical Assessment  Palliative Performance Scale Score 20%  Pain Max last 24 hours Not able to report  Pain Min Last 24 hours Not able to report  Dyspnea Max Last 24 Hours Not able to report  Dyspnea Min Last 24 hours Not able to report  Psychosocial & Spiritual Assessment  Palliative Care Outcomes      Time In: 1450 Time Out: 1540 Time Total: 50 minutes  Greater than 50%  of this time was spent counseling and coordinating care related to the above assessment and plan.  Signed by: 09/21/19, NP   Please contact Palliative Medicine Team phone at [phone] for questions and concerns.  For individual provider: See [phone]

## 2019-09-19 NOTE — Progress Notes (Signed)
TRH night shift.  The staff reports that the patient has been restless despite receiving 1 mg of haloperidol earlier.  She has a history of dementia with sundowning.  Lorazepam 0.5 mg every 4 hours as needed x2 doses has been ordered.  Sanda Klein, MD

## 2019-09-19 NOTE — H&P (Signed)
History and Physical:    [name]   ZDG:[id number] DOB: 12-21-1919 DOA: 09/19/2019  Referring MD/provider: Dr. Kathrynn Humble PCP: Celene Squibb, MD   Patient coming from: Home  Chief Complaint: Right leg pain and inability to walk.  History is per patient's son [name] over the phone.  History of Present Illness:   [name] is an 86 y.o. female who lives at home with her son [name].  She normally is able to walk slowly with a walker.  About 10 days ago patient's complained of right hip pain.  She was seen by her PCP who thought she might have a bursitis and was given an injection.  She was also started on twice daily meloxicam.  Patient initially did well with improvement of pain however subsequently developed progressive increase in right sided hip pain.  Apparently for the past 3 days she has been dragging her leg due to pain.  Last night patient woke up her son saying that her pain was very bad so he decided to bring her to the ED.  He notes they had difficulty getting her to the car due to her difficulty in ambulating.  Patient denies that she has otherwise been unwell.  She has not had any fevers or chills.  She does not seem to have had malaise.  She has had a reasonably good appetite.  Other than her hip pain she is at baseline per patient's son.  ED Course:  The patient underwent a CT scan which showed a 5 cm hematoma in the iliopsoas area.  I guess there was some concern for septic arthritis although it sounds like the injection she had last week was a bursitis injection not an intra-articular injection.  CRP and sed rate were done and are negative.  Patient is now admitted for inability to ambulate and be taken care of at home.  ROS:   ROS   Review of Systems: Patient unable to provide review of systems due to dementia.  Past Medical History:   Past Medical History:  Diagnosis Date  . Chronic headache   . DDD (degenerative disc disease), cervical   . Dementia  ([place])   . Generalized weakness   . Hypertension   . Hypoglycemia   . Pulmonary embolism ([place])   . Sinusitis     Past Surgical History:   Past Surgical History:  Procedure Laterality Date  . APPENDECTOMY      Social History:   Social History   Socioeconomic History  . Marital status: Widowed    Spouse name: Not on file  . Number of children: Not on file  . Years of education: Not on file  . Highest education level: Not on file  Occupational History  . Not on file  Tobacco Use  . Smoking status: Never Smoker  . Smokeless tobacco: Never Used  Vaping Use  . Vaping Use: Never used  Substance and Sexual Activity  . Alcohol use: No  . Drug use: No  . Sexual activity: Yes    Birth control/protection: Post-menopausal  Other Topics Concern  . Not on file  Social History Narrative  . Not on file   Social Determinants of Health   Financial Resource Strain:   . Difficulty of Paying Living Expenses:   Food Insecurity:   . Worried About Charity fundraiser in the Last Year:   . Arboriculturist in the Last Year:   Transportation Needs:   . Lack  of Transportation (Medical):   Marland Kitchen Lack of Transportation (Non-Medical):   Physical Activity:   . Days of Exercise per Week:   . Minutes of Exercise per Session:   Stress:   . Feeling of Stress :   Social Connections:   . Frequency of Communication with Friends and Family:   . Frequency of Social Gatherings with Friends and Family:   . Attends Religious Services:   . Active Member of Clubs or Organizations:   . Attends Banker Meetings:   Marland Kitchen Marital Status:   Intimate Partner Violence:   . Fear of Current or Ex-Partner:   . Emotionally Abused:   Marland Kitchen Physically Abused:   . Sexually Abused:     Allergies   Codeine  Family history:   History reviewed. No pertinent family history.  Current Medications:   Prior to Admission medications   Medication Sig Start Date End Date Taking? Authorizing Provider    Cholecalciferol (VITAMIN D3) 250 MCG (10000 UT) TABS Take 1 tablet by mouth daily.   Yes [provider]  losartan (COZAAR) 50 MG tablet Take 50 mg by mouth every morning. 08/17/19  Yes [provider]  sertraline (ZOLOFT) 25 MG tablet Take 12.5 mg by mouth daily.  09/27/18  Yes [provider]    Physical Exam:   Vitals:   09/19/19 1209 09/19/19 1230 09/19/19 1300 09/19/19 1330  BP:  136/80 134/71 (!) 110/59  Pulse: (!) 101     Resp:      Temp:      TempSrc:      SpO2: 95%     Weight:      Height:         Physical Exam: Blood pressure (!) 110/59, pulse (!) 101, temperature 98.6 F (37 C), temperature source Oral, resp. rate 18, height 5\' 1"  (1.549 m), weight 65.8 kg, SpO2 95 %. Gen: Elderly patient with brawny edema bilateral legs sitting up in bed talking to herself. Eyes: sclera anicteric, conjuctiva mildly injected bilaterally CVS: S1-S2, regulary, 2/6 systolic murmur Respiratory:  decreased air entry likely secondary to decreased inspiratory effort GI: NABS, soft, NT  LE: 3-4+ brawny edema which is shrinking.  She has some skin denudation but no evidence of cellulitis. Neuro: grossly nonfocal.  Psych: patient is fluent and answers questions appropriately but clearly has dementia with significant short-term memory loss.   Data Review:    Labs: Basic Metabolic Panel: Recent Labs  Lab 09/19/19 0735  NA 134*  K 4.2  CL 99  CO2 27  GLUCOSE 112*  BUN 29*  CREATININE 0.88  CALCIUM 8.5*   Liver Function Tests: Recent Labs  Lab 09/19/19 0735  AST 22  ALT 23  ALKPHOS 62  BILITOT 1.1  PROT 6.4*  ALBUMIN 3.6   No results for input(s): LIPASE, AMYLASE in the last 168 hours. No results for input(s): AMMONIA in the last 168 hours. CBC: Recent Labs  Lab 09/19/19 0735  WBC 9.4  NEUTROABS 7.1  HGB 14.3  HCT 43.0  MCV 97.9  PLT 141*   Cardiac Enzymes: No results for input(s): CKTOTAL, CKMB, CKMBINDEX, TROPONINI in the last 168  hours.  BNP (last 3 results) No results for input(s): PROBNP in the last 8760 hours. CBG: No results for input(s): GLUCAP in the last 168 hours.  Urinalysis    Component Value Date/Time   COLORURINE YELLOW 09/19/2019 1007   APPEARANCEUR CLEAR 09/19/2019 1007   LABSPEC 1.016 09/19/2019 1007   PHURINE 6.0 09/19/2019  1007   GLUCOSEU NEGATIVE 09/19/2019 1007   HGBUR NEGATIVE 09/19/2019 1007   BILIRUBINUR NEGATIVE 09/19/2019 1007   KETONESUR NEGATIVE 09/19/2019 1007   PROTEINUR NEGATIVE 09/19/2019 1007   UROBILINOGEN 0.2 10/17/2014 1030   NITRITE NEGATIVE 09/19/2019 1007   LEUKOCYTESUR TRACE (A) 09/19/2019 1007      Radiographic Studies: CT PELVIS WO CONTRAST  Result Date: 09/19/2019 CLINICAL DATA:  Acute hip pain, evaluate for occult fracture. No history of trauma, rather there is progressive swelling over 3 days EXAM: CT PELVIS WITHOUT CONTRAST TECHNIQUE: Multidetector CT imaging of the pelvis was performed following the standard protocol without intravenous contrast. COMPARISON:  Radiography from earlier today FINDINGS: The lower iliopsoas and medial compartment of the upper thigh shows muscular thickening with high-density appearance at the lower iliopsoas, likely a hematoma which is 5.5 cm in length and 2 cm in diameter. No visible associated fracture. No evidence of erosion or hip joint effusion. Advanced lumbar spine degeneration with L4-5 anterolisthesis. Advanced spinal stenosis at L4-5. No acute visceral finding. IMPRESSION: Distal iliopsoas and medial thigh muscular swelling. There is a probable hematoma at the iliopsoas tendon which favors strain over myositis. No visible fracture. Electronically Signed   By: Marnee Spring M.D.   On: 09/19/2019 09:05   DG Hip Unilat W or Wo Pelvis 2-3 Views Right  Result Date: 09/19/2019 CLINICAL DATA:  Pain without trauma. Right hip pain developing over 3 days. EXAM: DG HIP (WITH OR WITHOUT PELVIS) 2-3V RIGHT COMPARISON:  05/19/2012  FINDINGS: There is no evidence of hip fracture or dislocation. Generalized osteopenia.  Mild degenerative spurring at the hips. IMPRESSION: No acute finding Electronically Signed   By: Marnee Spring M.D.   On: 09/19/2019 05:36    EKG: Ordered and pending   Assessment/Plan:   Principal Problem:   Hematoma of right thigh Active Problems:   Dementia (HCC)   Hypertension   Pulmonary embolism (HCC)  86 year old female has developed a right thigh hematoma after bursitis injection last week preventing her from being able to walk due to pain.  She normally walks with a walker.  She is now placed in observation as patient son is unable to take care of her at home.  Hematoma of right thigh Patient has likely had a slowly developing hematoma since she has been on meloxicam twice daily since her injection last week. Hemoglobin is down by approximately one-point over the past 10 days Blood pressure is stable Patient is not on any blood thinners other than the meloxicam which is being discontinued Will repeat H&H in the morning to ensure stability. Patient will need physical therapy consultation in the morning, she already has a walker at home. Patient is not in pain at rest so I have not ordered any pain medications other than Tylenol. She may need pretreatment of pain prior to PT.  Dementia Patient is very high risk of sundowning with attendant iatrogenesis. Discussed at length with ED staff and with son Onalee Hua who understands and would still like for her to be admitted as he is unable to take care of her at home. Continue Zoloft As needed Haldol ordered, patient would benefit from having family stay with her overnight if allowable given Covid guidelines Patient may benefit from Seroquel nightly if indeed she does sundown  HTN Continue losartan  History of pulmonary embolism Patient is not on any anticoagulation at home  CODE STATUS Patient son Onalee Hua states he needs to discuss this  with his brother and sister I noted  my strong recommendation for DNR status given advanced age and likelihood of successful CPR being exceptionally low should she have a cardiac arrest. In the meanwhile we will place full CODE STATUS on patient until we hear otherwise from family    Other information:   DVT prophylaxis: SCD ordered. Code Status: Full code pending discussion with family Family Communication: Spoke with patient's son Onalee Hua over the phone Disposition Plan: TBD Consults called: Case management, PT for the morning Admission status: Observation  Vang Kraeger Tublu Keeva Reisen [facility]  If 7PM-7AM, please contact night-coverage [email or web address] Password [facility] 09/19/2019, 4:35 PM

## 2019-09-19 NOTE — ED Provider Notes (Addendum)
  Physical Exam  BP (!) 189/92   Pulse 96   Temp 98.6 F (37 C) (Oral)   Resp 18   Ht 5\' 1"  (1.549 m)   Wt 65.8 kg   SpO2 97%   BMI 27.40 kg/m   Physical Exam  ED Course/Procedures     Procedures  MDM   Assuming care of patient from Dr. .   Patient in the ED for hip pain, R side. She hadhip injection on the same side recently. Workup thus far shows Xrays of the hip that is normal.  Concerning findings are as following : inability to stand up or ambulate. Important pending results are : labs, and case manager order.  According to Dr. Lynelle Doctor, plan is to f/u labs and case manager.   Patient had no complains, no concerns from the nursing side. Will continue to monitor.  Lynelle Doctor, MD 09/19/19 0803   1:12 PM Patient continues to have significant pain with any kind of movement of the hip.  CT scan reviewed and there is a hematoma. I ordered sed rate and CRP.  Concerns are that there could be a septic arthritis like picture that is causing this discomfort. Results of the ED work-up discussed with the patient and family.   1:13 PM:  I called hospitalist for admission, however given that there is no MRI capabilities today and likely no IR ability to do arthrocentesis, they want to wait for the sed rate CRP results come back before formulating a plan for admission. CT scan shows hematoma which is likely because of the bursitis injection she had received 2 weeks ago.  Doubt that is the cause for significant discomfort though.  09/21/19, MD 09/19/19 1313    3:24 PM  Patient sed rate and CRP are completely normal.  I suspect patient will benefit with MRI to ensure that the pain is because of the hematoma, as effectively septic arthritis has been ruled out .  It is possible that the hematoma secondary to the injection she had received for bursitis.   09/21/19, MD 09/19/19 1526  4:25 PM Dr. 09/21/19, hospitalist will admit the patient. I put in a  consult for case management, his placement will be something that family is desiring.    Luberta Robertson, MD 09/19/19 1625

## 2019-09-19 NOTE — ED Notes (Signed)
Pt is worried about "washing up" in the restroom, wants to go to her house. Reoriented frequently, son at bedside. Given Haldol 1mg  im

## 2019-09-19 NOTE — ED Provider Notes (Signed)
[facility] EMERGENCY DEPARTMENT Provider Note   CSN: [id number] Arrival date & time: 09/19/19  0346   Time seen 4:20 AM History Chief Complaint  Patient presents with  . Hip Pain    [name] is a 86 y.o. female.  HPI   Patient lives of her son who is here.  He states she normally walks with a walker.  She was seen by her primary care doctor on June 2 when she started complaining of some right hip pain.  She was diagnosed with bursitis and got a steroid shot in the area which helped her discomfort.  She also was given meloxicam which she has been taking twice a day.  Son states that over the past 2 to 3 days she has been having worsening pain and having difficulty walking.  She states it hurts when she bears weight and he states it hurts with any type of movement.  She also has been having some swelling of her legs.  He states she fell over a year ago and since then she has been sleeping in a chair.  She does not like sleeping in a recliner so her feet are always hanging down.  She has been having swelling of her feet.  They deny any injury before her hip got worse or any fever.  PCP Celene Squibb, MD   Past Medical History:  Diagnosis Date  . Chronic headache   . DDD (degenerative disc disease), cervical   . Generalized weakness   . Hypertension   . Hypoglycemia   . Pulmonary embolism ([place])   . Sinusitis     Patient Active Problem List   Diagnosis Date Noted  . Closed fracture of right proximal humerus 09/10/18 09/30/2018    Past Surgical History:  Procedure Laterality Date  . APPENDECTOMY       OB History    Gravida      Para      Term      Preterm      AB      Living  6     SAB      TAB      Ectopic      Multiple      Live Births              No family history on file.  Social History   Tobacco Use  . Smoking status: Never Smoker  . Smokeless tobacco: Never Used  Vaping Use  . Vaping Use: Never used  Substance Use Topics  .  Alcohol use: No  . Drug use: No  lives at home Lives with son Uses a walker  Home Medications Prior to Admission medications   Medication Sig Start Date End Date Taking? Authorizing Provider  losartan (COZAAR) 100 MG tablet Take 100 mg by mouth every evening.     [provider]  losartan (COZAAR) 50 MG tablet Take 50 mg by mouth every morning. 08/17/19   [provider]  sertraline (ZOLOFT) 25 MG tablet TK 1 T PO D 09/27/18   [provider]    Allergies    Codeine  Review of Systems   Review of Systems  All other systems reviewed and are negative.   Physical Exam Updated Vital Signs BP (!) 189/92   Pulse 96   Temp 98.6 F (37 C) (Oral)   Resp 18   Ht 5\' 1"  (1.549 m)   Wt 65.8 kg   SpO2 97%  BMI 27.40 kg/m   Physical Exam Vitals and nursing note reviewed.  Constitutional:      General: She is not in acute distress.    Appearance: Normal appearance. She is obese.  HENT:     Head: Normocephalic and atraumatic.  Eyes:     Extraocular Movements: Extraocular movements intact.     Conjunctiva/sclera: Conjunctivae normal.  Cardiovascular:     Rate and Rhythm: Normal rate.  Pulmonary:     Effort: Pulmonary effort is normal. No respiratory distress.  Musculoskeletal:        General: Swelling and tenderness present.     Cervical back: Normal range of motion.     Right lower leg: Edema present.     Left lower leg: Edema present.     Comments: Patient has diffuse swelling and redness with some chronic skin changes of both lower extremities between her knees and her ankles.  When I palpate her lower legs she states it makes her hip hurt.  I have a difficulty examining her hip because she keeps grabbing my hand and she keeps her hand in the way to keep me from examining her hip.  There is no obvious redness or warmth of the skin.  She seems to have some tenderness laterally but also may be some medially.  There is no shortening of her leg and no  internal or external rotation.  Skin:    General: Skin is warm and dry.     Findings: Erythema present.  Neurological:     General: No focal deficit present.     Mental Status: She is alert and oriented to person, place, and time.     Cranial Nerves: No cranial nerve deficit.  Psychiatric:        Mood and Affect: Mood normal.        Behavior: Behavior normal.        Thought Content: Thought content normal.       ED Results / Procedures / Treatments   Labs (all labs ordered are listed, but only abnormal results are displayed) Results for orders placed or performed during the hospital encounter of 09/10/18  CBC with Differential  Result Value Ref Range   WBC 4.5 4.0 - 10.5 K/uL   RBC 4.64 3.87 - 5.11 MIL/uL   Hemoglobin 15.1 (H) 12.0 - 15.0 g/dL   HCT 22.9 (H) 36 - 46 %   MCV 99.4 80.0 - 100.0 fL   MCH 32.5 26.0 - 34.0 pg   MCHC 32.8 30.0 - 36.0 g/dL   RDW 79.8 92.1 - 19.4 %   Platelets 120 (L) 150 - 400 K/uL   nRBC 0.0 0.0 - 0.2 %   Neutrophils Relative % 62 %   Neutro Abs 2.7 1.7 - 7.7 K/uL   Lymphocytes Relative 30 %   Lymphs Abs 1.3 0.7 - 4.0 K/uL   Monocytes Relative 7 %   Monocytes Absolute 0.3 0 - 1 K/uL   Eosinophils Relative 1 %   Eosinophils Absolute 0.0 0 - 0 K/uL   Basophils Relative 0 %   Basophils Absolute 0.0 0 - 0 K/uL   Immature Granulocytes 0 %   Abs Immature Granulocytes 0.02 0.00 - 0.07 K/uL  Basic metabolic panel  Result Value Ref Range   Sodium 138 135 - 145 mmol/L   Potassium 4.1 3.5 - 5.1 mmol/L   Chloride 104 98 - 111 mmol/L   CO2 26 22 - 32 mmol/L   Glucose, Bld 122 (H) 70 -  99 mg/dL   BUN 19 8 - 23 mg/dL   Creatinine, Ser 0.98 0.44 - 1.00 mg/dL   Calcium 8.7 (L) 8.9 - 10.3 mg/dL   GFR calc non Af Amer >60 >60 mL/min   GFR calc Af Amer >60 >60 mL/min   Anion gap 8 5 - 15   Laboratory interpretation all normal except hyperglycemia    EKG None  Radiology DG Hip Unilat W or Wo Pelvis 2-3 Views Right  Result Date:  09/19/2019 CLINICAL DATA:  Pain without trauma. Right hip pain developing over 3 days. EXAM: DG HIP (WITH OR WITHOUT PELVIS) 2-3V RIGHT COMPARISON:  05/19/2012 FINDINGS: There is no evidence of hip fracture or dislocation. Generalized osteopenia.  Mild degenerative spurring at the hips. IMPRESSION: No acute finding Electronically Signed   By: Marnee Spring M.D.   On: 09/19/2019 05:36    Procedures Procedures (including critical care time)  Medications Ordered in ED Medications  dexamethasone (DECADRON) injection 10 mg (has no administration in time range)  traMADol (ULTRAM) tablet 50 mg (50 mg Oral Given 09/19/19 0439)    ED Course  I have reviewed the triage vital signs and the nursing notes.  Pertinent labs & imaging results that were available during my care of the patient were reviewed by me and considered in my medical decision making (see chart for details).    MDM Rules/Calculators/A&P                          Patient was given tramadol for pain and an x-ray of her right hip was ordered  Recheck at 7:10 AM we discussed her x-ray result.  Son wants her admitted for physical therapy and rehab.  He states he can't take her home if she is unable to walk.  Patient refuses to try to move her leg and when her son tries to move her leg for her she tells him to stop.  She states it is only an aching pain now.  She however does not want to be admitted to the hospital.  Son has told her several times he can not take her home unless she is able to walk.  Laboratory testing was done including Covid testing.  I will talk to the hospitalist and see if they would admit her if not she will remain in the ED and have social worker look for placement for rehab..  08:04 Dr Rhunette Croft, will check [facility] and see if has criteria for admission or if she needs rehab/NH placement.  Final Clinical Impression(s) / ED Diagnoses Final diagnoses:  Right hip pain    Rx / DC Orders  Disposition pending  Devoria Albe, MD, Concha Pyo, MD 09/19/19 [phone]

## 2019-09-19 NOTE — ED Notes (Signed)
Sat up patient to eat dinner tray

## 2019-09-19 NOTE — TOC Initial Note (Signed)
Transition of Care [facility]) - Initial/Assessment Note   Patient Details  Name: [name] MRN: [id number] Date of Birth: 1919-09-21  [facility] CM/SW Contact:    Sherie Don, LCSW Phone Number: 09/19/2019, 7:29 PM  Clinical Narrative: Patient is a 86 year old female admitted for hematoma of right thigh. TOC received consult for possible SNF placement. CSW met with patient's son, Delara Shepheard, in the ED. Per son, patient currently lives at home with him. Son reported patient had a fall last year and injured her right arm. Patient has continued to declined over the past year and son now has difficulty assisting patient with her ADLs. Son reported patient will only sponge bathe herself as she does not feel comfortable using the step-in shower he had installed. CSW discussed SNF if PT recommends rehab. Son requested [facility] as first choice and patient can be referred to SNFs in [place] and [place] as son and patient live in [place]. PASRR received: [id number]. FL2 started.  Expected Discharge Plan: Skilled Nursing Facility Barriers to Discharge: Continued Medical Work up  Patient Goals and CMS Choice Patient states their goals for this hospitalization and ongoing recovery are:: Go to SNF for rehab or home with PT to improve strength CMS Medicare.gov Compare Post Acute Care list provided to:: Patient Represenative (must comment) Arita Miss (son)) Choice offered to / list presented to : Adult Children Arita Miss)  Expected Discharge Plan and Services Expected Discharge Plan: [place] Discharge Planning Services: NA Post Acute Care Choice: [place] Living arrangements for the past 2 months: Single Family Home               Prior Living Arrangements/Services Living arrangements for the past 2 months: Single Family Home Lives with:: Adult Children (Lives with son, Brelynn Wheller) Patient language and need for interpreter reviewed:: Yes Do you  feel safe going back to the place where you live?: Yes Need for Family Participation in Patient Care: Yes (Comment) (Patient has dementia) Care giver support system in place?: Yes (comment) Arita Miss (son)) Current home services: DME [facility]) Criminal Activity/Legal Involvement Pertinent to Current Situation/Hospitalization: No - Comment as needed  Activities of Daily Living Home Assistive Devices/Equipment: Environmental consultant (specify type) ADL Screening (condition at time of admission) Patient's cognitive ability adequate to safely complete daily activities?: Yes Is the patient deaf or have difficulty hearing?: No Does the patient have difficulty seeing, even when wearing glasses/contacts?: No Does the patient have difficulty concentrating, remembering, or making decisions?: No Patient able to express need for assistance with ADLs?: Yes Does the patient have difficulty dressing or bathing?: No Independently performs ADLs?: Yes (appropriate for developmental age) Does the patient have difficulty walking or climbing stairs?: Yes Weakness of Legs: Both Weakness of Arms/Hands: None  Permission Sought/Granted Permission sought to share information with : Facility Sport and exercise psychologist (SNFs) Permission granted to share information with : Yes, Verbal Permission Granted  Emotional Assessment Appearance:: Appears stated age Attitude/Demeanor/Rapport: Unable to Assess Affect (typically observed): Unable to Assess Orientation: : Oriented to Self, Oriented to Situation Alcohol / Substance Use: Not Applicable Psych Involvement: No (comment)  Admission diagnosis:  Hematoma of right thigh [S70.11XA] Patient Active Problem List   Diagnosis Date Noted  . Hematoma of right thigh 09/19/2019  . Goals of care, counseling/discussion   . Palliative care by specialist   . DNR (do not resuscitate) discussion   . Dementia ([employer])   . Hypertension   . Pulmonary embolism ([place])   .  Closed fracture of right  proximal humerus 09/10/18 09/30/2018   PCP:  Celene Squibb, MD Pharmacy:   [facility] [place], [place] AT [place] [address] [place] [address] Phone: [phone] Fax: [phone]  Readmission Risk Interventions No flowsheet data found.

## 2019-09-19 NOTE — ED Triage Notes (Signed)
Pt presents to er with c/o right hip pain that became worse over the past three days, denies any injury. Pt has bilateral lower extremity swelling.

## 2019-09-20 DIAGNOSIS — Z6827 Body mass index (BMI) 27.0-27.9, adult: Secondary | ICD-10-CM | POA: Diagnosis not present

## 2019-09-20 DIAGNOSIS — Z9181 History of falling: Secondary | ICD-10-CM | POA: Diagnosis not present

## 2019-09-20 DIAGNOSIS — R627 Adult failure to thrive: Secondary | ICD-10-CM | POA: Diagnosis present

## 2019-09-20 DIAGNOSIS — Z66 Do not resuscitate: Secondary | ICD-10-CM | POA: Diagnosis present

## 2019-09-20 DIAGNOSIS — Z993 Dependence on wheelchair: Secondary | ICD-10-CM | POA: Diagnosis not present

## 2019-09-20 DIAGNOSIS — F0280 Dementia in other diseases classified elsewhere without behavioral disturbance: Secondary | ICD-10-CM | POA: Diagnosis not present

## 2019-09-20 DIAGNOSIS — M719 Bursopathy, unspecified: Secondary | ICD-10-CM | POA: Diagnosis present

## 2019-09-20 DIAGNOSIS — S7010XA Contusion of unspecified thigh, initial encounter: Secondary | ICD-10-CM | POA: Diagnosis present

## 2019-09-20 DIAGNOSIS — Z515 Encounter for palliative care: Secondary | ICD-10-CM | POA: Diagnosis not present

## 2019-09-20 DIAGNOSIS — I10 Essential (primary) hypertension: Secondary | ICD-10-CM | POA: Diagnosis present

## 2019-09-20 DIAGNOSIS — R262 Difficulty in walking, not elsewhere classified: Secondary | ICD-10-CM | POA: Diagnosis present

## 2019-09-20 DIAGNOSIS — Z7189 Other specified counseling: Secondary | ICD-10-CM

## 2019-09-20 DIAGNOSIS — Y848 Other medical procedures as the cause of abnormal reaction of the patient, or of later complication, without mention of misadventure at the time of the procedure: Secondary | ICD-10-CM | POA: Diagnosis present

## 2019-09-20 DIAGNOSIS — R531 Weakness: Secondary | ICD-10-CM | POA: Diagnosis present

## 2019-09-20 DIAGNOSIS — S7011XD Contusion of right thigh, subsequent encounter: Secondary | ICD-10-CM | POA: Diagnosis not present

## 2019-09-20 DIAGNOSIS — S7012XA Contusion of left thigh, initial encounter: Secondary | ICD-10-CM | POA: Diagnosis present

## 2019-09-20 DIAGNOSIS — F0391 Unspecified dementia with behavioral disturbance: Secondary | ICD-10-CM | POA: Diagnosis present

## 2019-09-20 DIAGNOSIS — M96840 Postprocedural hematoma of a musculoskeletal structure following a musculoskeletal system procedure: Secondary | ICD-10-CM | POA: Diagnosis present

## 2019-09-20 DIAGNOSIS — S7011XA Contusion of right thigh, initial encounter: Secondary | ICD-10-CM | POA: Diagnosis not present

## 2019-09-20 DIAGNOSIS — F05 Delirium due to known physiological condition: Secondary | ICD-10-CM | POA: Diagnosis present

## 2019-09-20 DIAGNOSIS — Z20822 Contact with and (suspected) exposure to covid-19: Secondary | ICD-10-CM | POA: Diagnosis present

## 2019-09-20 DIAGNOSIS — Z86711 Personal history of pulmonary embolism: Secondary | ICD-10-CM | POA: Diagnosis not present

## 2019-09-20 DIAGNOSIS — Z791 Long term (current) use of non-steroidal anti-inflammatories (NSAID): Secondary | ICD-10-CM | POA: Diagnosis not present

## 2019-09-20 LAB — BASIC METABOLIC PANEL
Anion gap: 7 (ref 5–15)
BUN: 28 mg/dL — ABNORMAL HIGH (ref 8–23)
CO2: 26 mmol/L (ref 22–32)
Calcium: 8.2 mg/dL — ABNORMAL LOW (ref 8.9–10.3)
Chloride: 105 mmol/L (ref 98–111)
Creatinine, Ser: 0.87 mg/dL (ref 0.44–1.00)
GFR calc Af Amer: >60 mL/min (ref >60)
GFR calc non Af Amer: 55 mL/min — ABNORMAL LOW (ref >60)
Glucose, Bld: 127 mg/dL — ABNORMAL HIGH (ref 70–99)
Potassium: 4.2 mmol/L (ref 3.5–5.1)
Sodium: 138 mmol/L (ref 135–145)

## 2019-09-20 LAB — CBC
HCT: 39.8 % (ref 36.0–46.0)
Hemoglobin: 13.4 g/dL (ref 12.0–15.0)
MCH: 33.1 pg (ref 26.0–34.0)
MCHC: 33.7 g/dL (ref 30.0–36.0)
MCV: 98.3 fL (ref 80.0–100.0)
Platelets: 142 10*3/uL — ABNORMAL LOW (ref 150–400)
RBC: 4.05 MIL/uL (ref 3.87–5.11)
RDW: 14.3 % (ref 11.5–15.5)
WBC: 10.4 10*3/uL (ref 4.0–10.5)
nRBC: 0 % (ref 0.0–0.2)

## 2019-09-20 NOTE — Progress Notes (Signed)
Palliative: Mrs. Killman is resting quietly in bed with her eyes closed.  She is fiddling with her blanket.  She will tell me her name, and open her eyes but not make eye contact when I asked.  She has known dementia.  Her son Smitty Cords is at bedside.  Bruce states that Mrs. Marchitto has had declines over the last few years.  He states that she is at times resisting care.  Bruce and I talked about CODE STATUS.  He states that he would want a DNR for himself, and would want that for his mother.  Bruce is in agreement for short-term rehab at this point.  I share that Mrs. Noecker would likely qualify for in-home hospice care soon.  We talked about the benefits of hospice in detail.  I encouraged him to consider how best to care for Mrs. Spark.  Call to son, Onalee Hua, main Management consultant.  Onalee Hua tells me that he is getting things together to bring to the hospital.  Onalee Hua tells me that he would prefer to talk at a later time.  He shares that he is expecting to talk to transition of care team later today.  Conference with attending, TOC team, bedside nursing staff related to patient condition, needs, goals of care, recommendations for in-home hospice.  Plan:   At this point the goal is short-term rehab, no bed offers at this time.  Outpatient palliative services to follow to continue hospice discussions.  35 minutes Lillia Carmel, NP Palliative Medicine Team Team Phone # [phone] Greater than 50% of this time was spent counseling and coordinating care related to the above assessment and plan.

## 2019-09-20 NOTE — NC FL2 (Signed)
[place] MEDICAID FL2 LEVEL OF CARE SCREENING TOOL     IDENTIFICATION  Patient Name: [name] Birthdate: Jan 21, 1920 Sex: female Admission Date (Current Location): 09/19/2019  [facility] and [place] Number:  [employer] and Address:  [place] [address], [place]      Provider Number: [id number]  Attending Physician Name and Address:  Kathie Dike, MD  Relative Name and Phone Number:  Alixandria Friedt (son) [facility] [place]: [phone]    Current Level of Care: Hospital Recommended Level of Care: [place] Prior Approval Number:    Date Approved/Denied:   PASRR Number: [id number]  Discharge Plan: SNF    Current Diagnoses: Patient Active Problem List   Diagnosis Date Noted  . Hematoma of right thigh 09/19/2019  . Goals of care, counseling/discussion   . Palliative care by specialist   . DNR (do not resuscitate) discussion   . Dementia ([place])   . Hypertension   . Pulmonary embolism ([place])   . Closed fracture of right proximal humerus 09/10/18 09/30/2018    Orientation RESPIRATION BLADDER Height & Weight     Time, Place  Normal Incontinent Weight: 82.4 kg Height:  5\' 1"  (154.9 cm)  BEHAVIORAL SYMPTOMS/MOOD NEUROLOGICAL BOWEL NUTRITION STATUS      Continent Diet (see DC summary)  AMBULATORY STATUS COMMUNICATION OF NEEDS Skin   Extensive Assist Verbally Other (Comment) (Cellulitis - leg)                       Personal Care Assistance Level of Assistance  Bathing, Feeding, Dressing Bathing Assistance: Limited assistance Feeding assistance: Independent Dressing Assistance: Limited assistance     Functional Limitations Info  Sight, Hearing, Speech Sight Info: Impaired Hearing Info: Impaired Speech Info: Adequate    SPECIAL CARE FACTORS FREQUENCY  PT (By licensed PT)     PT Frequency: 5 times a week              Contractures Contractures Info: Not present    Additional Factors Info  Code Status,  Allergies, Psychotropic Code Status Info: Full Allergies Info: Codeine Psychotropic Info: Zoloft (sertraline); Haldol (haloperidol lactate) PRN for agitation         Current Medications (09/20/2019):  This is the current hospital active medication list Current Facility-Administered Medications  Medication Dose Route Frequency Provider Last Rate Last Admin  . acetaminophen (TYLENOL) tablet 650 mg  650 mg Oral Q6H PRN Vashti Hey, MD   650 mg at 09/19/19 2122   Or  . acetaminophen (TYLENOL) suppository 650 mg  650 mg Rectal Q6H PRN Bonnell Public Tublu, MD      . bisacodyl (DULCOLAX) suppository 10 mg  10 mg Rectal Daily PRN Bonnell Public Tublu, MD      . haloperidol lactate (HALDOL) injection 1 mg  1 mg Intramuscular Q6H PRN Vashti Hey, MD   1 mg at 09/19/19 1907  . losartan (COZAAR) tablet 50 mg  50 mg Oral q morning - 10a Chatterjee, Dewaine Oats Tublu, MD      . polyethylene glycol (MIRALAX / GLYCOLAX) packet 17 g  17 g Oral Daily PRN Bonnell Public Tublu, MD      . sertraline (ZOLOFT) tablet 12.5 mg  12.5 mg Oral Daily Bonnell Public Tublu, MD      . sodium phosphate (FLEET) 7-19 GM/118ML enema 1 enema  1 enema Rectal Once PRN Vashti Hey, MD         Discharge Medications: Please  see discharge summary for a list of discharge medications.  Relevant Imaging Results:  Relevant Lab Results:   Additional Information SSN#: [id number]  Leitha Bleak, RN

## 2019-09-20 NOTE — Plan of Care (Signed)
  Problem: Acute Rehab PT Goals(only PT should resolve) Goal: Pt Will Go Supine/Side To Sit Outcome: Progressing Flowsheets (Taken 09/20/2019 0929) Pt will go Supine/Side to Sit:  with supervision  with min guard assist Goal: Patient Will Transfer Sit To/From Stand Outcome: Progressing Flowsheets (Taken 09/20/2019 0929) Patient will transfer sit to/from stand: with min guard assist Goal: Pt Will Transfer Bed To Chair/Chair To Bed Outcome: Progressing Flowsheets (Taken 09/20/2019 0929) Pt will Transfer Bed to Chair/Chair to Bed: min guard assist Goal: Pt Will Ambulate Outcome: Progressing Flowsheets (Taken 09/20/2019 0929) Pt will Ambulate:  100 feet  with min guard assist  with rolling walker   9:30 AM, 09/20/19 Ocie Bob, MPT Physical Therapist with [facility] [phone] office [phone] mobile phone

## 2019-09-20 NOTE — Care Management Obs Status (Signed)
MEDICARE OBSERVATION STATUS NOTIFICATION   Patient Details  Name: [name] MRN: [id number] Date of Birth: 1919/07/19   Medicare Observation Status Notification Given:  Yes (patient unable to sign, son Onalee Hua was informed and given letter)    Corey Harold 09/20/2019, 4:36 PM

## 2019-09-20 NOTE — Progress Notes (Signed)
PROGRESS NOTE    [name]  IPJ:[id number] DOB: 1919-12-26 DOA: 09/19/2019 PCP: Benita Stabile, MD    Brief Narrative:  86 year old female who lives at home with her son, brought to the hospital with right leg pain and difficulty walking.  She was found to have right thigh hematoma.  She was admitted for further treatments.   Assessment & Plan:   Principal Problem:   Hematoma of right thigh Active Problems:   Dementia (HCC)   Hypertension   Pulmonary embolism (HCC)   Encounter for hospice care discussion   Thigh hematoma   1. Right thigh hematoma.  Patient developed pain in her right hip after receiving a injection at her primary care physician.  She is not on any anticoagulation.  Due to persistent pain and difficulty walking, she came to the emergency room where imaging indicated distal iliopsoas and medial thigh muscular swelling.  Currently, her pain appears to be managed.  She is seen by physical therapy with recommendation for skilled nursing facility placement. 2. Dementia.  Patient did have some sundowning overnight.  Will start on Seroquel nightly. 3. Hypertension.  Stable on losartan. 4. History of pulmonary embolism.  Currently not on anticoagulation. 5. Goals of care.  Seen by palliative care.  Plans are for skilled nursing facility placement.  Currently, family would want the patient resuscitated.  Healthcare team has recommended DNR. 6. Generalized weakness with difficulty with ambulation.  Seen by physical therapy with recommendations for skilled nursing facility placement.   DVT prophylaxis: SCDs Start: 09/19/19 1648  Code Status: full code Family Communication: discussed with son Bruce at the bedside Disposition Plan: Status is: Inpatient  Remains inpatient appropriate because:Unsafe d/c plan   Dispo: The patient is from: Home              Anticipated d/c is to: SNF              Anticipated d/c date is: 1 day              Patient currently is medically  stable to d/c.   Consultants:   Palliative care  Procedures:     Antimicrobials:       Subjective: Sleeping on arrival, wakes up to voice, denies any pain.  Objective: Vitals:   09/19/19 2235 09/20/19 0215 09/20/19 0532 09/20/19 1319  BP: (!) 112/42 (!) 162/71 (!) 174/74   Pulse: 87 (!) 101 96 (!) 105  Resp:    (!) 22  Temp: 98 F (36.7 C) 98.9 F (37.2 C) 99 F (37.2 C)   TempSrc: Oral Oral Oral   SpO2: 92% 96% 95% 95%  Weight:   82.4 kg   Height:        Intake/Output Summary (Last 24 hours) at 09/20/2019 1651 Last data filed at 09/20/2019 1300 Gross per 24 hour  Intake 583.76 ml  Output --  Net 583.76 ml   Filed Weights   09/19/19 0403 09/20/19 0532  Weight: 65.8 kg 82.4 kg    Examination:  General exam: Appears calm and comfortable  Respiratory system: Clear to auscultation. Respiratory effort normal. Cardiovascular system: S1 & S2 heard, RRR. No JVD, murmurs, rubs, gallops or clicks. No pedal edema. Gastrointestinal system: Abdomen is nondistended, soft and nontender. No organomegaly or masses felt. Normal bowel sounds heard. Central nervous system:  No focal neurological deficits. Extremities: 2+ edema bilaterally Skin: Venous stasis changes with brawny edema bilaterally Psychiatry: Somnolent, wakes up to voice    Data Reviewed: I  have personally reviewed following labs and imaging studies  CBC: Recent Labs  Lab 09/19/19 0735 09/20/19 0614  WBC 9.4 10.4  NEUTROABS 7.1  --   HGB 14.3 13.4  HCT 43.0 39.8  MCV 97.9 98.3  PLT 141* 195*   Basic Metabolic Panel: Recent Labs  Lab 09/19/19 0735 09/20/19 0614  NA 134* 138  K 4.2 4.2  CL 99 105  CO2 27 26  GLUCOSE 112* 127*  BUN 29* 28*  CREATININE 0.88 0.87  CALCIUM 8.5* 8.2*   GFR: Estimated Creatinine Clearance: 35.1 mL/min (by C-G formula based on SCr of 0.87 mg/dL). Liver Function Tests: Recent Labs  Lab 09/19/19 0735  AST 22  ALT 23  ALKPHOS 62  BILITOT 1.1  PROT 6.4*   ALBUMIN 3.6   No results for input(s): LIPASE, AMYLASE in the last 168 hours. No results for input(s): AMMONIA in the last 168 hours. Coagulation Profile: No results for input(s): INR, PROTIME in the last 168 hours. Cardiac Enzymes: No results for input(s): CKTOTAL, CKMB, CKMBINDEX, TROPONINI in the last 168 hours. BNP (last 3 results) No results for input(s): PROBNP in the last 8760 hours. HbA1C: No results for input(s): HGBA1C in the last 72 hours. CBG: No results for input(s): GLUCAP in the last 168 hours. Lipid Profile: No results for input(s): CHOL, HDL, LDLCALC, TRIG, CHOLHDL, LDLDIRECT in the last 72 hours. Thyroid Function Tests: No results for input(s): TSH, T4TOTAL, FREET4, T3FREE, THYROIDAB in the last 72 hours. Anemia Panel: No results for input(s): VITAMINB12, FOLATE, FERRITIN, TIBC, IRON, RETICCTPCT in the last 72 hours. Sepsis Labs: No results for input(s): PROCALCITON, LATICACIDVEN in the last 168 hours.  Recent Results (from the past 240 hour(s))  SARS Coronavirus 2 by RT PCR (hospital order, performed in [facility] hospital lab) Nasopharyngeal Nasopharyngeal Swab     Status: None   Collection Time: 09/19/19  7:14 AM   Specimen: Nasopharyngeal Swab  Result Value Ref Range Status   SARS Coronavirus 2 NEGATIVE NEGATIVE Final    Comment: (NOTE) SARS-CoV-2 target nucleic acids are NOT DETECTED.  The SARS-CoV-2 RNA is generally detectable in upper and lower respiratory specimens during the acute phase of infection. The lowest concentration of SARS-CoV-2 viral copies this assay can detect is 250 copies / mL. A negative result does not preclude SARS-CoV-2 infection and should not be used as the sole basis for treatment or other patient management decisions.  A negative result may occur with improper specimen collection / handling, submission of specimen other than nasopharyngeal swab, presence of viral mutation(s) within the areas targeted by this assay, and  inadequate number of viral copies (<250 copies / mL). A negative result must be combined with clinical observations, patient history, and epidemiological information.  Fact Sheet for Patients:   StrictlyIdeas.no  Fact Sheet for Healthcare Providers: BankingDealers.co.za  This test is not yet approved or  cleared by the [place] FDA and has been authorized for detection and/or diagnosis of SARS-CoV-2 by FDA under an Emergency Use Authorization (EUA).  This EUA will remain in effect (meaning this test can be used) for the duration of the COVID-19 declaration under Section 564(b)(1) of the Act, 21 U.S.C. section 360bbb-3(b)(1), unless the authorization is terminated or revoked sooner.  Performed at [facility], [address] [place], [place] [address]          Radiology Studies: CT PELVIS WO CONTRAST  Result Date: 09/19/2019 CLINICAL DATA:  Acute hip pain, evaluate for occult fracture. No history of  trauma, rather there is progressive swelling over 3 days EXAM: CT PELVIS WITHOUT CONTRAST TECHNIQUE: Multidetector CT imaging of the pelvis was performed following the standard protocol without intravenous contrast. COMPARISON:  Radiography from earlier today FINDINGS: The lower iliopsoas and medial compartment of the upper thigh shows muscular thickening with high-density appearance at the lower iliopsoas, likely a hematoma which is 5.5 cm in length and 2 cm in diameter. No visible associated fracture. No evidence of erosion or hip joint effusion. Advanced lumbar spine degeneration with L4-5 anterolisthesis. Advanced spinal stenosis at L4-5. No acute visceral finding. IMPRESSION: Distal iliopsoas and medial thigh muscular swelling. There is a probable hematoma at the iliopsoas tendon which favors strain over myositis. No visible fracture. Electronically Signed   By: Marnee Spring M.D.   On: 09/19/2019 09:05   DG Hip Unilat W or Wo Pelvis  2-3 Views Right  Result Date: 09/19/2019 CLINICAL DATA:  Pain without trauma. Right hip pain developing over 3 days. EXAM: DG HIP (WITH OR WITHOUT PELVIS) 2-3V RIGHT COMPARISON:  05/19/2012 FINDINGS: There is no evidence of hip fracture or dislocation. Generalized osteopenia.  Mild degenerative spurring at the hips. IMPRESSION: No acute finding Electronically Signed   By: Marnee Spring M.D.   On: 09/19/2019 05:36        Scheduled Meds: . losartan  50 mg Oral q morning - 10a  . sertraline  12.5 mg Oral Daily   Continuous Infusions:   LOS: 0 days    Time spent:    Erick Blinks, MD [facility]   If 7PM-7AM, please contact night-coverage [email or web address]  09/20/2019, 4:51 PM

## 2019-09-20 NOTE — Evaluation (Signed)
Physical Therapy Evaluation Patient Details Name: [name] MRN: [id number] DOB: 10-Aug-1919 Today's Date: 09/20/2019   History of Present Illness  [name] is an 86 y.o. female who lives at home with her son [name].  She normally is able to walk slowly with a walker.  About 10 days ago patient's complained of right hip pain.  She was seen by her PCP who thought she might have a bursitis and was given an injection.  She was also started on twice daily meloxicam.  Patient initially did well with improvement of pain however subsequently developed progressive increase in right sided hip pain.  Apparently for the past 3 days she has been dragging her leg due to pain.  Last night patient woke up her son saying that her pain was very bad so he decided to bring her to the ED.  He notes they had difficulty getting her to the car due to her difficulty in ambulating.    Clinical Impression  Patient appears slightly confused, impulsive and requires repeated verbal/tactile cueing to complete functional tasks.  During initial sit to standing, patient leans backwards, after verbal/tactile cueing able to maintain standing balance leaning on RW, has difficulty extending neck due to kyphotic posture requiring verbal/tactile cueing to avoid bumping into nearby objects during gait training, no loss of balance, limited due to fatigue and tolerated sitting up in chair to eat breakfast after therapy - RN/NT notified.  Patient will benefit from continued physical therapy in hospital and recommended venue below to increase strength, balance, endurance for safe ADLs and gait.    Follow Up Recommendations SNF;Supervision for mobility/OOB;Supervision/Assistance - 24 hour    Equipment Recommendations  None recommended by PT    Recommendations for Other Services       Precautions / Restrictions Precautions Precautions: Fall Restrictions Weight Bearing Restrictions: No      Mobility  Bed Mobility Overal  bed mobility: Needs Assistance Bed Mobility: Supine to Sit     Supine to sit: Min assist     General bed mobility comments: increased time, labored movement  Transfers Overall transfer level: Needs assistance Equipment used: Rolling walker (2 wheeled) Transfers: Sit to/from [facility] Sit to Stand: Min assist Stand pivot transfers: Min assist       General transfer comment: slow labored movement  Ambulation/Gait Ambulation/Gait assistance: Min assist Gait Distance (Feet): 50 Feet Assistive device: Rolling walker (2 wheeled) Gait Pattern/deviations: Decreased step length - right;Decreased step length - left;Decreased stride length;Trunk flexed Gait velocity: decreased   General Gait Details: slightly labored cadence with kyphotic trunk resulting in difficulty extending neck to see forward, limited secondary to fatigue, no loss of balance  Stairs            Wheelchair Mobility    Modified Rankin (Stroke Patients Only)       Balance Overall balance assessment: Needs assistance Sitting-balance support: Feet supported;No upper extremity supported Sitting balance-Leahy Scale: Fair Sitting balance - Comments: fair/good seated at EOB   Standing balance support: During functional activity;Bilateral upper extremity supported Standing balance-Leahy Scale: Fair Standing balance comment: using RW                             Pertinent Vitals/Pain Pain Assessment: No/denies pain    Home Living Family/patient expects to be discharged to:: Private residence Living Arrangements: Children Available Help at Discharge: Family;Available PRN/intermittently  Home Equipment: Dan Humphreys - 2 wheels Additional Comments: Patient is poor historian    Prior Function Level of Independence: Needs assistance   Gait / Transfers Assistance Needed: Supervised household ambulator with RW  ADL's / Homemaking Assistance Needed: assisted by family         Hand Dominance        Extremity/Trunk Assessment   Upper Extremity Assessment Upper Extremity Assessment: Generalized weakness    Lower Extremity Assessment Lower Extremity Assessment: Generalized weakness    Cervical / Trunk Assessment Cervical / Trunk Assessment: Kyphotic  Communication   Communication: HOH  Cognition Arousal/Alertness: Awake/alert Behavior During Therapy: Impulsive;Restless Overall Cognitive Status: History of cognitive impairments - at baseline                                 General Comments: Requires repeated verbal/tactile cueing to follow instructions      General Comments      Exercises     Assessment/Plan    PT Assessment Patient needs continued PT services  PT Problem List Decreased strength;Decreased activity tolerance;Decreased balance;Decreased mobility;Decreased safety awareness       PT Treatment Interventions Gait training;Stair training;Functional mobility training;Therapeutic activities;Therapeutic exercise;Patient/family education    PT Goals (Current goals can be found in the Care Plan section)  Acute Rehab PT Goals Patient Stated Goal: return home PT Goal Formulation: With patient Time For Goal Achievement: 10/04/19 Potential to Achieve Goals: Good    Frequency Min 3X/week   Barriers to discharge        Co-evaluation               AM-PAC PT "6 Clicks" Mobility  Outcome Measure Help needed turning from your back to your side while in a flat bed without using bedrails?: A Little Help needed moving from lying on your back to sitting on the side of a flat bed without using bedrails?: A Little Help needed moving to and from a bed to a chair (including a wheelchair)?: A Lot Help needed standing up from a chair using your arms (e.g., wheelchair or bedside chair)?: A Little Help needed to walk in hospital room?: A Lot Help needed climbing 3-5 steps with a railing? : A Lot 6 Click Score: 15     End of Session   Activity Tolerance: Patient tolerated treatment well;Patient limited by fatigue Patient left: in chair;with call bell/phone within reach;with chair alarm set Nurse Communication: Mobility status PT Visit Diagnosis: Unsteadiness on feet (R26.81);Other abnormalities of gait and mobility (R26.89);Muscle weakness (generalized) (M62.81)    Time: 7169-6789 PT Time Calculation (min) (ACUTE ONLY): 34 min   Charges:   PT Evaluation $PT Eval Moderate Complexity: 1 Mod PT Treatments $Therapeutic Activity: 23-37 mins        9:24 AM, 09/20/19 Ocie Bob, MPT Physical Therapist with [facility] [phone] office [phone] mobile phone

## 2019-09-20 NOTE — TOC Initial Note (Addendum)
Transition of Care [facility]) - Initial/Assessment Note    Patient Details  Name: [name] MRN: [id number] Date of Birth: Jun 08, 1919  [facility]) CM/SW Contact:    Boneta Lucks, RN Phone Number: 09/20/2019, 11:06 AM  Clinical Narrative:           Patient admitted with Hematoma of right thigh. Patient lives at home with her son [name]. PT is recommending SNF.  Per [name] he is with her 24 hours a day.  He is open to SNF if necessary. He does want her back home. He will visit her today to see how she is doing. She walked 50 feet with PT. He gave permission to send out to SNF and will decide when she is medically ready. TOC to follow.       Addendum :  TOC at the bedside to speak with son - [name] to discuss bed offers and ratings,  he accepted the bed offer at [facility]. Patient not managed by Tora Duck updated and will start [place].  Expected Discharge Plan: Skilled Nursing Facility Barriers to Discharge: Continued Medical Work up   Patient Goals and CMS Choice Patient states their goals for this hospitalization and ongoing recovery are:: Go to SNF for rehab or home with PT to improve strength CMS Medicare.gov Compare Post Acute Care list provided to:: Patient Represenative (must comment) Arita Miss (son)) Choice offered to / list presented to : Adult Children Arita Miss)  Expected Discharge Plan and Services Expected Discharge Plan: [place]   Discharge Planning Services: NA Post Acute Care Choice: [place] Living arrangements for the past 2 months: Single Family Home                   Prior Living Arrangements/Services Living arrangements for the past 2 months: Single Family Home Lives with:: Adult Children (Lives with son, Manda Holstad) Patient language and need for interpreter reviewed:: Yes Do you feel safe going back to the place where you live?: Yes      Need for Family Participation in Patient Care: Yes (Comment)  (Patient has dementia) Care giver support system in place?: Yes (comment) Arita Miss (son)) Current home services: DME [facility]) Criminal Activity/Legal Involvement Pertinent to Current Situation/Hospitalization: No - Comment as needed  Activities of Daily Living Home Assistive Devices/Equipment: [facility] (specify type), Hospital bed ADL Screening (condition at time of admission) Patient's cognitive ability adequate to safely complete daily activities?: Yes Is the patient deaf or have difficulty hearing?: No Does the patient have difficulty seeing, even when wearing glasses/contacts?: No Does the patient have difficulty concentrating, remembering, or making decisions?: Yes Patient able to express need for assistance with ADLs?: Yes Does the patient have difficulty dressing or bathing?: Yes Independently performs ADLs?: No Communication: Independent Is this a change from baseline?: Pre-admission baseline Dressing (OT): Needs assistance Is this a change from baseline?: Pre-admission baseline Grooming: Needs assistance Is this a change from baseline?: Pre-admission baseline Feeding: Independent Bathing: Needs assistance Is this a change from baseline?: Pre-admission baseline Toileting: Needs assistance Is this a change from baseline?: Pre-admission baseline In/Out Bed: Needs assistance Is this a change from baseline?: Pre-admission baseline Walks in Home: Needs assistance Is this a change from baseline?: Pre-admission baseline Does the patient have difficulty walking or climbing stairs?: Yes Weakness of Legs: None Weakness of Arms/Hands: None  Permission Sought/Granted Permission sought to share information with : Facility Sport and exercise psychologist (SNFs) Permission granted to share information with : Yes, Verbal  Permission Granted    Emotional Assessment Appearance:: Appears stated age Attitude/Demeanor/Rapport: Unable to Assess Affect (typically observed): Unable to  Assess Orientation: : Oriented to Self, Oriented to Situation Alcohol / Substance Use: Not Applicable Psych Involvement: No (comment)  Admission diagnosis:  Right hip pain [M25.551] Hematoma of right thigh [S70.11XA] Patient Active Problem List   Diagnosis Date Noted  . Hematoma of right thigh 09/19/2019  . Goals of care, counseling/discussion   . Palliative care by specialist   . DNR (do not resuscitate) discussion   . Dementia (HCC)   . Hypertension   . Pulmonary embolism (HCC)   . Closed fracture of right proximal humerus 09/10/18 09/30/2018   PCP:  Benita Stabile, MD Pharmacy:   [facility] [phone] - [place],  - [address] AT [facility] OF [address] & [address] [place] [address] Phone: [phone] Fax: [phone]

## 2019-09-21 DIAGNOSIS — F0280 Dementia in other diseases classified elsewhere without behavioral disturbance: Secondary | ICD-10-CM

## 2019-09-21 DIAGNOSIS — S7011XD Contusion of right thigh, subsequent encounter: Secondary | ICD-10-CM

## 2019-09-21 NOTE — TOC Progression Note (Signed)
Transition of Care [facility]) - Progression Note    Patient Details  Name: [name] MRN: [id number] Date of Birth: 10-01-1919  Transition of Care [facility]) CM/SW Contact  Annice Needy, LCSW Phone Number: 09/21/2019, 3:20 PM  Clinical Narrative:    [facility] at [facility] accepts patient. Attending notified. Facility can admit patient 09/22/19 to residential hospice.    Expected Discharge Plan: Skilled Nursing Facility Barriers to Discharge: Continued Medical Work up  Expected Discharge Plan and Services Expected Discharge Plan: Skilled Nursing Facility   Discharge Planning Services: NA Post Acute Care Choice: Skilled Nursing Facility Living arrangements for the past 2 months: Single Family Home                                       Social Determinants of Health (SDOH) Interventions    Readmission Risk Interventions No flowsheet data found.

## 2019-09-21 NOTE — TOC Progression Note (Signed)
Transition of Care [facility]) - Progression Note    Patient Details  Name: MAKEILA YAMAGUCHI MRN: [id number] Date of Birth: 1920-02-03  Transition [facility] Care [facility]) CM/SW Contact  Annice Needy, LCSW Phone Number: 09/21/2019, 12:16 PM  Clinical Narrative:    Per attending, patient is now full comfort and family is agreeable to residential hospice. Son, Onalee Hua, discussed confirmed residential hospice as a choice and chooses [facility].  Referral made to [facility] for residential hospice services.    Expected Discharge Plan: Skilled Nursing Facility Barriers to Discharge: Continued Medical Work up  Expected Discharge Plan and Services Expected Discharge Plan: Skilled Nursing Facility   Discharge Planning Services: NA Post Acute Care Choice: Skilled Nursing Facility Living arrangements for the past 2 months: Single Family Home                                       Social Determinants of Health (SDOH) Interventions    Readmission Risk Interventions No flowsheet data found.

## 2019-09-21 NOTE — Progress Notes (Signed)
PROGRESS NOTE  [name] GXQ:[id number] DOB: 1919-07-04 DOA: 09/19/2019 PCP: Celene Squibb, MD  Brief History:  86 year old female with dementia and HTN who lives at home with her son, brought to the hospital with right leg pain and difficulty walking.  She was found to have right thigh hematoma.  She was admitted for further treatments.  Assessment/Plan: 1. Right thigh hematoma.  Patient developed pain in her right hip after receiving a injection at her primary care physician.  She is not on any anticoagulation.  Due to persistent pain and difficulty walking, she came to the emergency room where imaging indicated distal iliopsoas and medial thigh muscular swelling.  Currently, her pain appears to be managed.   2. Failure to Thrive---both sons relate that patient has had cognitive and functional decline over last 6-9 months.  Now with poor po intake and resistance to treatments.   3. Dementia.  Patient did have some sundowning overnight.  Will start on Seroquel nightly. 4. Hypertension.  Stable on losartan. 5. History of pulmonary embolism.  Currently not on anticoagulation. 6. Goals of care.  Seen by palliative care.   -09/21/19--I had extensive goals of care discussion with patient and sons.  Advance care planning, including the explanation and discussion of advance directives was carried out with the patient and family.  Code status including explanations of "Full Code" and "DNR" and alternatives were discussed in detail.  Discussion of end-of-life issues including but not limited palliative care, hospice care and the concept of hospice, other end-of-life care options, power of attorney for health care decisions, living wills, and physician orders for life-sustaining treatment were also discussed with the patient and family.  Overall prognosis is poor as discussed with sons.  Sons agree with transitioning focus of care to solely comfort and referral to residential  hospice.      Status is: Inpatient  Remains inpatient appropriate because:Unsafe d/c plan   Dispo: The patient is from: Home              Anticipated d/c is to: Residential Hospice              Anticipated d/c date is: 1 day              Patient currently is not medically stable to d/c.        Family Communication:   Family at bedside updated 6/16  Consultants:  palliative  Code Status:  DNR--FULL COMFORT  DVT Prophylaxis:  FULL COMFORT   Procedures: As Listed in Progress Note Above  Antibiotics: None    Total time spent 35 minutes.  Greater than 50% spent face to face counseling and coordinating care.     Subjective:  Patient denies fevers, chills, headache, chest pain, dyspnea, nausea, vomiting, diarrhea, abdominal pain, dysuria, hematuria, hematochezia, and melena.  Objective: Vitals:   09/20/19 2117 09/20/19 2118 09/20/19 2200 09/21/19 0624  BP: (!) 178/78   (!) 167/90  Pulse: (!) 101   (!) 110  Resp:   19   Temp: 98.5 F (36.9 C)   97.8 F (36.6 C)  TempSrc: Axillary     SpO2: 96% 96%  95%  Weight:    80.4 kg  Height:        Intake/Output Summary (Last 24 hours) at 09/21/2019 1144 Last data filed at 09/20/2019 1700 Gross per 24 hour  Intake 0 ml  Output --  Net 0 ml   Weight  change: -2 kg Exam:   General:  Pt is alert, follows commands appropriately, not in acute distress  HEENT: No icterus, No thrush, No neck mass, [place]/AT  Cardiovascular: RRR, S1/S2, no rubs, no gallops  Respiratory: CTA bilaterally, no wheezing, no crackles, no rhonchi  Abdomen: Soft/+BS, non tender, non distended, no guarding  Extremities: 2 + LE edema, No lymphangitis, No petechiae, No rashes, no synovitis   Data Reviewed: I have personally reviewed following labs and imaging studies Basic Metabolic Panel: Recent Labs  Lab 09/19/19 0735 09/20/19 0614  NA 134* 138  K 4.2 4.2  CL 99 105  CO2 27 26  GLUCOSE 112* 127*  BUN 29* 28*  CREATININE 0.88  0.87  CALCIUM 8.5* 8.2*   Liver Function Tests: Recent Labs  Lab 09/19/19 0735  AST 22  ALT 23  ALKPHOS 62  BILITOT 1.1  PROT 6.4*  ALBUMIN 3.6   No results for input(s): LIPASE, AMYLASE in the last 168 hours. No results for input(s): AMMONIA in the last 168 hours. Coagulation Profile: No results for input(s): INR, PROTIME in the last 168 hours. CBC: Recent Labs  Lab 09/19/19 0735 09/20/19 0614  WBC 9.4 10.4  NEUTROABS 7.1  --   HGB 14.3 13.4  HCT 43.0 39.8  MCV 97.9 98.3  PLT 141* 142*   Cardiac Enzymes: No results for input(s): CKTOTAL, CKMB, CKMBINDEX, TROPONINI in the last 168 hours. BNP: Invalid input(s): POCBNP CBG: No results for input(s): GLUCAP in the last 168 hours. HbA1C: No results for input(s): HGBA1C in the last 72 hours. Urine analysis:    Component Value Date/Time   COLORURINE YELLOW 09/19/2019 1007   APPEARANCEUR CLEAR 09/19/2019 1007   LABSPEC 1.016 09/19/2019 1007   PHURINE 6.0 09/19/2019 1007   GLUCOSEU NEGATIVE 09/19/2019 1007   HGBUR NEGATIVE 09/19/2019 1007   BILIRUBINUR NEGATIVE 09/19/2019 1007   KETONESUR NEGATIVE 09/19/2019 1007   PROTEINUR NEGATIVE 09/19/2019 1007   UROBILINOGEN 0.2 10/17/2014 1030   NITRITE NEGATIVE 09/19/2019 1007   LEUKOCYTESUR TRACE (A) 09/19/2019 1007   Sepsis Labs: @LABRCNTIP (procalcitonin:4,lacticidven:4) ) Recent Results (from the past 240 hour(s))  SARS Coronavirus 2 by RT PCR (hospital order, performed in [facility] hospital lab) Nasopharyngeal Nasopharyngeal Swab     Status: None   Collection Time: 09/19/19  7:14 AM   Specimen: Nasopharyngeal Swab  Result Value Ref Range Status   SARS Coronavirus 2 NEGATIVE NEGATIVE Final    Comment: (NOTE) SARS-CoV-2 target nucleic acids are NOT DETECTED.  The SARS-CoV-2 RNA is generally detectable in upper and lower respiratory specimens during the acute phase of infection. The lowest concentration of SARS-CoV-2 viral copies this assay can detect is  250 copies / mL. A negative result does not preclude SARS-CoV-2 infection and should not be used as the sole basis for treatment or other patient management decisions.  A negative result may occur with improper specimen collection / handling, submission of specimen other than nasopharyngeal swab, presence of viral mutation(s) within the areas targeted by this assay, and inadequate number of viral copies (<250 copies / mL). A negative result must be combined with clinical observations, patient history, and epidemiological information.  Fact Sheet for Patients:   09/21/19  Fact Sheet for Healthcare Providers: BoilerBrush.com.cy  This test is not yet approved or  cleared by the https://pope.com/ FDA and has been authorized for detection and/or diagnosis of SARS-CoV-2 by FDA under an Emergency Use Authorization (EUA).  This EUA will remain in effect (meaning this test can be  used) for the duration of the COVID-19 declaration under Section 564(b)(1) of the Act, 21 U.S.C. section 360bbb-3(b)(1), unless the authorization is terminated or revoked sooner.  Performed at [facility], [address] [place], [place] [address]      Scheduled Meds: . losartan  50 mg Oral q morning - 10a  . sertraline  12.5 mg Oral Daily   Continuous Infusions:  Procedures/Studies: CT PELVIS WO CONTRAST  Result Date: 09/19/2019 CLINICAL DATA:  Acute hip pain, evaluate for occult fracture. No history of trauma, rather there is progressive swelling over 3 days EXAM: CT PELVIS WITHOUT CONTRAST TECHNIQUE: Multidetector CT imaging of the pelvis was performed following the standard protocol without intravenous contrast. COMPARISON:  Radiography from earlier today FINDINGS: The lower iliopsoas and medial compartment of the upper thigh shows muscular thickening with high-density appearance at the lower iliopsoas, likely a hematoma which is 5.5 cm in length and 2 cm  in diameter. No visible associated fracture. No evidence of erosion or hip joint effusion. Advanced lumbar spine degeneration with L4-5 anterolisthesis. Advanced spinal stenosis at L4-5. No acute visceral finding. IMPRESSION: Distal iliopsoas and medial thigh muscular swelling. There is a probable hematoma at the iliopsoas tendon which favors strain over myositis. No visible fracture. Electronically Signed   By: Marnee Spring M.D.   On: 09/19/2019 09:05   DG Hip Unilat W or Wo Pelvis 2-3 Views Right  Result Date: 09/19/2019 CLINICAL DATA:  Pain without trauma. Right hip pain developing over 3 days. EXAM: DG HIP (WITH OR WITHOUT PELVIS) 2-3V RIGHT COMPARISON:  05/19/2012 FINDINGS: There is no evidence of hip fracture or dislocation. Generalized osteopenia.  Mild degenerative spurring at the hips. IMPRESSION: No acute finding Electronically Signed   By: Marnee Spring M.D.   On: 09/19/2019 05:36    Catarina Hartshorn, DO  [facility]  If 7PM-7AM, please contact night-coverage [email or web address] Password [facility] 09/21/2019, 11:44 AM   LOS: 1 day

## 2019-09-21 NOTE — Progress Notes (Signed)
Palliative: [name] is lying quietly in bed.  She does not open her eyes or try to interact with me in any meaningful wakeful way when I call her name.  When I touch her gently on her arm she startles and cries out as if she is having pain.  Son [name] is at bedside.  We talked about residential hospice, comfort care.  No questions at this time.  Son [name] arrives and again, we talked about residential hospice and comfort care.  We talked about CODE STATUS.  Conference with attending, bedside nursing staff, transition of care team related to patient condition, needs, residential hospice placement.  [place] form completed   Plan: Comfort and dignity at end-of-life, residential hospice at [facility] in [place]. Prognosis: Discussed with family with permission.  2 weeks or less is expected if [name] does not eat and drink.  25 minutes Lillia Carmel, NP Palliative Medicine Team Team Phone # [phone] Greater than 50% of this time was spent counseling and coordinating care related to the above assessment and plan.

## 2019-09-22 DIAGNOSIS — R627 Adult failure to thrive: Secondary | ICD-10-CM

## 2019-09-22 NOTE — Discharge Summary (Signed)
Physician Discharge Summary  [name] [id number]:[id number] DOB: 1919/12/06 DOA: 09/19/2019  PCP: Benita Stabile, MD  Admit date: 09/19/2019 Discharge date: 09/22/2019  Admitted From: Home Disposition:  Residential Hospice    Discharge Condition: Stable CODE STATUS: FULL COMFORT Diet recommendation: COMFORT FEEDS   Brief/Interim Summary: 86 year old female with history of dementia and hypertension presented with right hip pain.  The patient was seen by her PCP approximately 2 weeks prior to this admission.  She was given an injection in her right hip area for presumed bursitis.  She was also started on meloxicam.  The patient initially did well with improvement, but subsequently developed progressive increasing right hip pain.  She had difficulty bearing weight and ambulating.  Because of worsening pain she was brought to the emergency department for further evaluation.  CT of the pelvis showed a 5 cm hematoma in the right iliopsoas area.  Further discussion with the patient's sons revealed that the patient has had a cognitive and functional decline over the last 6 months.  The patient is essentially wheelchair-bound and requires assistance for all her activities of daily living.  She has also had failure to thrive with decreased oral intake and resistance to any kind of therapies and treatment.  Further goals of care discussion was undertaken with the patient's sons.  Palliative medicine was consulted.  Ultimately, the patient's sons felt that it was in the patient's best interest to transition her focus of care to focus solely on comfort.  With assistance of social work, the patient was transitioned to residential hospice.  Discharge Diagnoses:  1. Right thigh hematoma. Patient developed pain in her right hip after receiving a injection at her primary care physician. She is not on any anticoagulation. Due to persistent pain and difficulty walking, she came to the emergency room where imaging  indicated distal iliopsoas and medial thigh muscular swelling. Currently, her pain appears to be managed.  2. Failure to Thrive---both sons relate that patient has had cognitive and functional decline over last 6-9 months.  Now with poor po intake and resistance to treatments.   3. Dementia with behavioral disturbance. Patient did have some sundowning.  started on Seroquel nightly. 4. Hypertension. Stable on losartan. 5. History of pulmonary embolism. Currently not on anticoagulation. 6. Goals of care. Seen by palliative care.  -09/21/19--I had extensive goals of care discussion with patient and sons.  Advance care planning, including the explanation and discussion of advance directives was carried out with the patient and family.  Code status including explanations of "Full Code" and "DNR" and alternatives were discussed in detail.  Discussion of end-of-life issues including but not limited palliative care, hospice care and the concept of hospice, other end-of-life care options, power of attorney for health care decisions, living wills, and physician orders for life-sustaining treatment were also discussed with the patient and family.  Overall prognosis is poor as discussed with sons.  Sons agree with transitioning focus of care to solely comfort and referral to residential hospice.    Discharge Instructions   Allergies as of 09/22/2019      Reactions   Codeine Nausea And Vomiting      Medication List    STOP taking these medications   losartan 50 MG tablet Commonly known as: COZAAR     TAKE these medications   sertraline 25 MG tablet Commonly known as: ZOLOFT Take 12.5 mg by mouth daily.   Vitamin D3 250 MCG (10000 UT) Tabs Take 1 tablet by mouth  daily.       Contact information for after-discharge care    Destination    [facility] .   Service: Inpatient Hospice Contact information: [address] [place]  [address] [phone]                 Allergies  Allergen Reactions  . Codeine Nausea And Vomiting    Consultations:  Palliative medicine   Procedures/Studies: CT PELVIS WO CONTRAST  Result Date: 09/19/2019 CLINICAL DATA:  Acute hip pain, evaluate for occult fracture. No history of trauma, rather there is progressive swelling over 3 days EXAM: CT PELVIS WITHOUT CONTRAST TECHNIQUE: Multidetector CT imaging of the pelvis was performed following the standard protocol without intravenous contrast. COMPARISON:  Radiography from earlier today FINDINGS: The lower iliopsoas and medial compartment of the upper thigh shows muscular thickening with high-density appearance at the lower iliopsoas, likely a hematoma which is 5.5 cm in length and 2 cm in diameter. No visible associated fracture. No evidence of erosion or hip joint effusion. Advanced lumbar spine degeneration with L4-5 anterolisthesis. Advanced spinal stenosis at L4-5. No acute visceral finding. IMPRESSION: Distal iliopsoas and medial thigh muscular swelling. There is a probable hematoma at the iliopsoas tendon which favors strain over myositis. No visible fracture. Electronically Signed   By: Marnee Spring M.D.   On: 09/19/2019 09:05   DG Hip Unilat W or Wo Pelvis 2-3 Views Right  Result Date: 09/19/2019 CLINICAL DATA:  Pain without trauma. Right hip pain developing over 3 days. EXAM: DG HIP (WITH OR WITHOUT PELVIS) 2-3V RIGHT COMPARISON:  05/19/2012 FINDINGS: There is no evidence of hip fracture or dislocation. Generalized osteopenia.  Mild degenerative spurring at the hips. IMPRESSION: No acute finding Electronically Signed   By: Marnee Spring M.D.   On: 09/19/2019 05:36         Discharge Exam: Vitals:   09/21/19 2207 09/22/19 0519  BP: (!) 193/101 (!) 179/92  Pulse: (!) 113 (!) 102  Resp: 20   Temp: 99.4 F (37.4 C) 97.9 F (36.6 C)  SpO2: 97% 96%   Vitals:   09/21/19 2022 09/21/19 2150 09/21/19 2207 09/22/19  0519  BP:  (!) 177/87 (!) 193/101 (!) 179/92  Pulse:  (!) 115 (!) 113 (!) 102  Resp:  20 20   Temp:  98.4 F (36.9 C) 99.4 F (37.4 C) 97.9 F (36.6 C)  TempSrc:  Oral  Oral  SpO2: 94% 96% 97% 96%  Weight:    80.1 kg  Height:        General: Pt is alert, awake, not in acute distress Cardiovascular: RRR, S1/S2 +, no rubs, no gallops Respiratory: bibasilar crackles, no wheeze Abdominal: Soft, NT, ND, bowel sounds + Extremities: 1+ LE edema, no cyanosis   The results of significant diagnostics from this hospitalization (including imaging, microbiology, ancillary and laboratory) are listed below for reference.    Significant Diagnostic Studies: CT PELVIS WO CONTRAST  Result Date: 09/19/2019 CLINICAL DATA:  Acute hip pain, evaluate for occult fracture. No history of trauma, rather there is progressive swelling over 3 days EXAM: CT PELVIS WITHOUT CONTRAST TECHNIQUE: Multidetector CT imaging of the pelvis was performed following the standard protocol without intravenous contrast. COMPARISON:  Radiography from earlier today FINDINGS: The lower iliopsoas and medial compartment of the upper thigh shows muscular thickening with high-density appearance at the lower iliopsoas, likely a hematoma which is 5.5 cm in length and 2 cm in diameter. No visible associated fracture.  No evidence of erosion or hip joint effusion. Advanced lumbar spine degeneration with L4-5 anterolisthesis. Advanced spinal stenosis at L4-5. No acute visceral finding. IMPRESSION: Distal iliopsoas and medial thigh muscular swelling. There is a probable hematoma at the iliopsoas tendon which favors strain over myositis. No visible fracture. Electronically Signed   By: Marnee Spring M.D.   On: 09/19/2019 09:05   DG Hip Unilat W or Wo Pelvis 2-3 Views Right  Result Date: 09/19/2019 CLINICAL DATA:  Pain without trauma. Right hip pain developing over 3 days. EXAM: DG HIP (WITH OR WITHOUT PELVIS) 2-3V RIGHT COMPARISON:  05/19/2012  FINDINGS: There is no evidence of hip fracture or dislocation. Generalized osteopenia.  Mild degenerative spurring at the hips. IMPRESSION: No acute finding Electronically Signed   By: Marnee Spring M.D.   On: 09/19/2019 05:36     Microbiology: Recent Results (from the past 240 hour(s))  SARS Coronavirus 2 by RT PCR (hospital order, performed in [facility] hospital lab) Nasopharyngeal Nasopharyngeal Swab     Status: None   Collection Time: 09/19/19  7:14 AM   Specimen: Nasopharyngeal Swab  Result Value Ref Range Status   SARS Coronavirus 2 NEGATIVE NEGATIVE Final    Comment: (NOTE) SARS-CoV-2 target nucleic acids are NOT DETECTED.  The SARS-CoV-2 RNA is generally detectable in upper and lower respiratory specimens during the acute phase of infection. The lowest concentration of SARS-CoV-2 viral copies this assay can detect is 250 copies / mL. A negative result does not preclude SARS-CoV-2 infection and should not be used as the sole basis for treatment or other patient management decisions.  A negative result may occur with improper specimen collection / handling, submission of specimen other than nasopharyngeal swab, presence of viral mutation(s) within the areas targeted by this assay, and inadequate number of viral copies (<250 copies / mL). A negative result must be combined with clinical observations, patient history, and epidemiological information.  Fact Sheet for Patients:   BoilerBrush.com.cy  Fact Sheet for Healthcare Providers: https://pope.com/  This test is not yet approved or  cleared by the [place] FDA and has been authorized for detection and/or diagnosis of SARS-CoV-2 by FDA under an Emergency Use Authorization (EUA).  This EUA will remain in effect (meaning this test can be used) for the duration of the COVID-19 declaration under Section 564(b)(1) of the Act, 21 U.S.C. section 360bbb-3(b)(1), unless the  authorization is terminated or revoked sooner.  Performed at [facility], [address], [place], [place] [address]      Labs: Basic Metabolic Panel: Recent Labs  Lab 09/19/19 0735 09/20/19 0614  NA 134* 138  K 4.2 4.2  CL 99 105  CO2 27 26  GLUCOSE 112* 127*  BUN 29* 28*  CREATININE 0.88 0.87  CALCIUM 8.5* 8.2*   Liver Function Tests: Recent Labs  Lab 09/19/19 0735  AST 22  ALT 23  ALKPHOS 62  BILITOT 1.1  PROT 6.4*  ALBUMIN 3.6   No results for input(s): LIPASE, AMYLASE in the last 168 hours. No results for input(s): AMMONIA in the last 168 hours. CBC: Recent Labs  Lab 09/19/19 0735 09/20/19 0614  WBC 9.4 10.4  NEUTROABS 7.1  --   HGB 14.3 13.4  HCT 43.0 39.8  MCV 97.9 98.3  PLT 141* 142*   Cardiac Enzymes: No results for input(s): CKTOTAL, CKMB, CKMBINDEX, TROPONINI in the last 168 hours. BNP: Invalid input(s): POCBNP CBG: No results for input(s): GLUCAP in the last 168 hours.  Time coordinating  discharge:  36 minutes  Signed:  Orson Eva, DO [facility] Pager: [phone] 09/22/2019, 10:23 AM

## 2019-10-06 DEATH — deceased

## 2020-02-27 IMAGING — CT CT HEAD W/O CM
3 series · 15 of 47 positions shown, 18 images · non-contrast
Comparison: 09/23/2017

CLINICAL DATA: Three day history of headache.

EXAM:
CT HEAD WITHOUT CONTRAST
TECHNIQUE: Contiguous axial images were obtained from the base of the skull
through the vertex without intravenous contrast.

[Series 2: head trauma wo · axial · 0.43mm/px · z∈[+25,+150]mm · 9 of 31 slices shown, 12 images]
[im 3/31  brain]
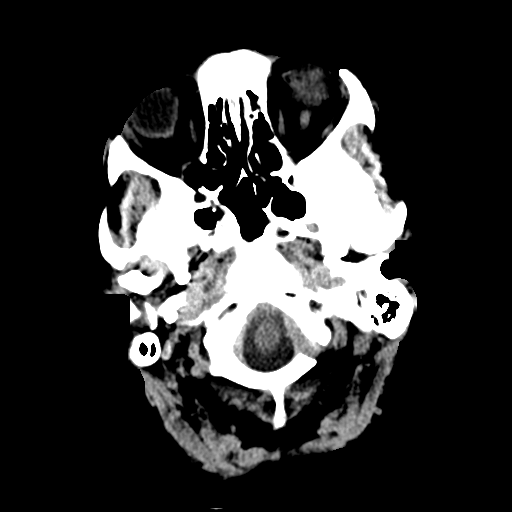
[im 3/31  bone]
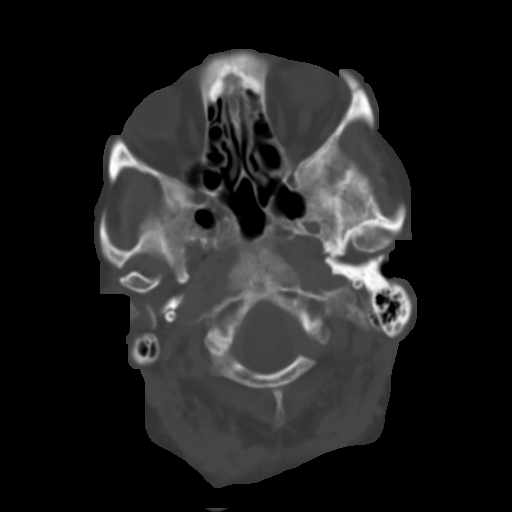
[im 6/31  brain]
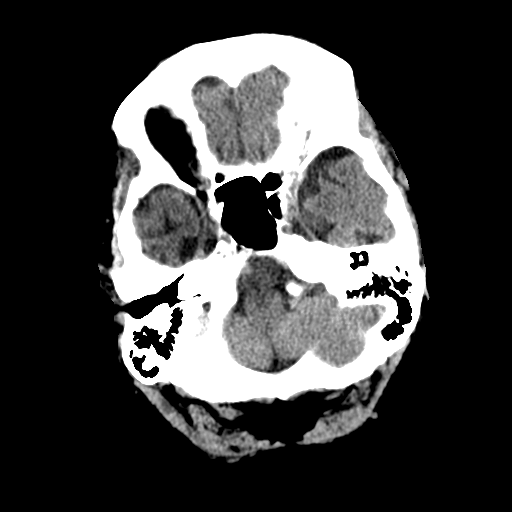
[im 9/31  brain]
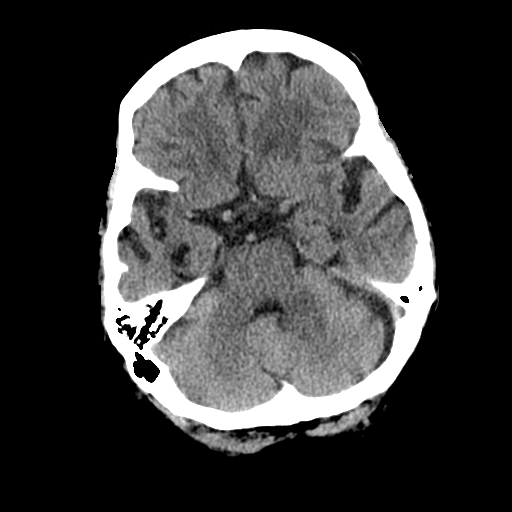
[im 12/31  brain]
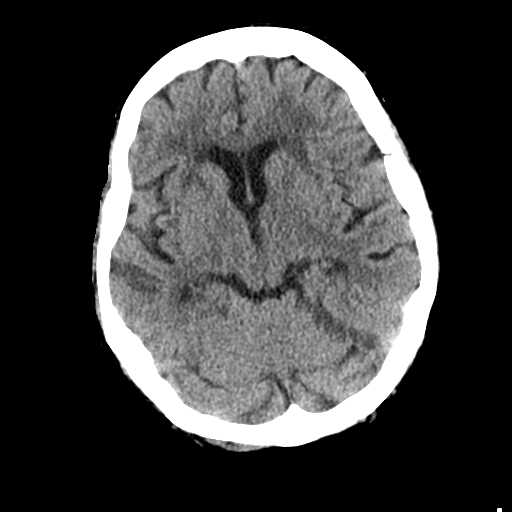
[im 16/31  brain]
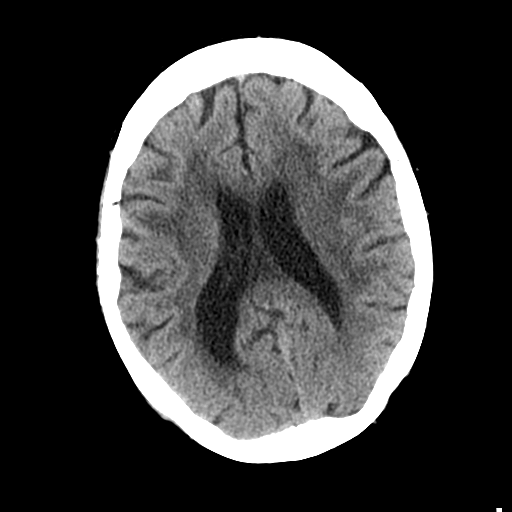
[im 16/31  bone]
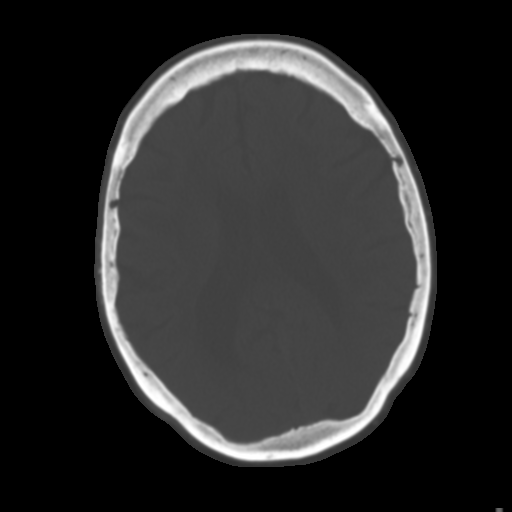
[im 19/31  brain]
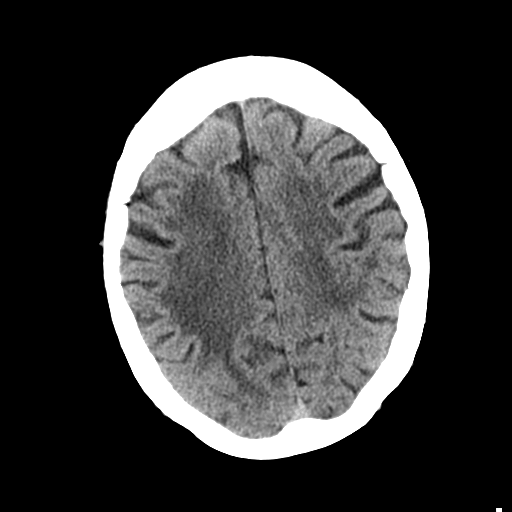
[im 22/31  brain]
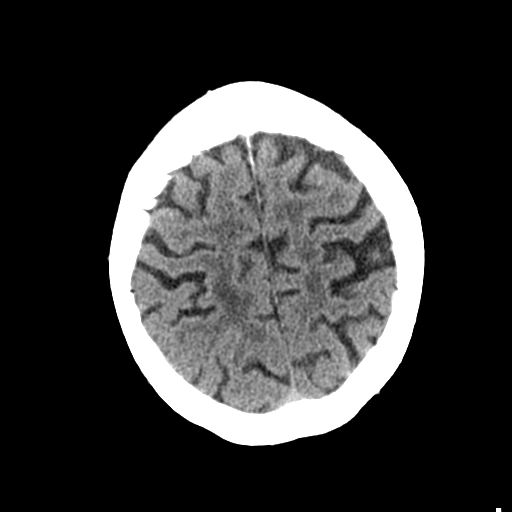
[im 25/31  brain]
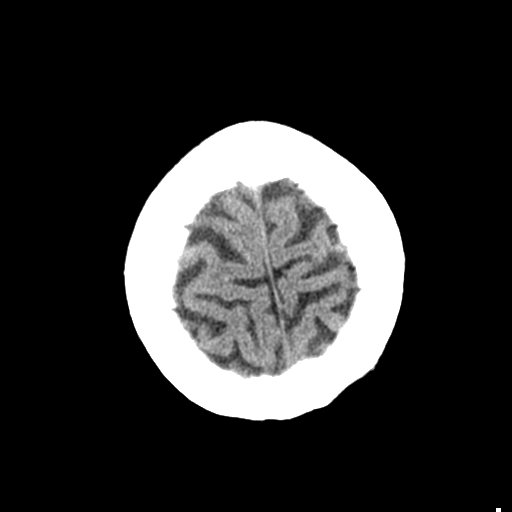
[im 28/31  brain]
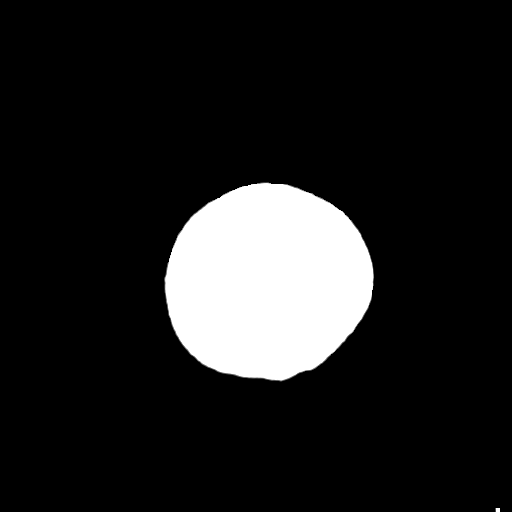
[im 28/31  bone]
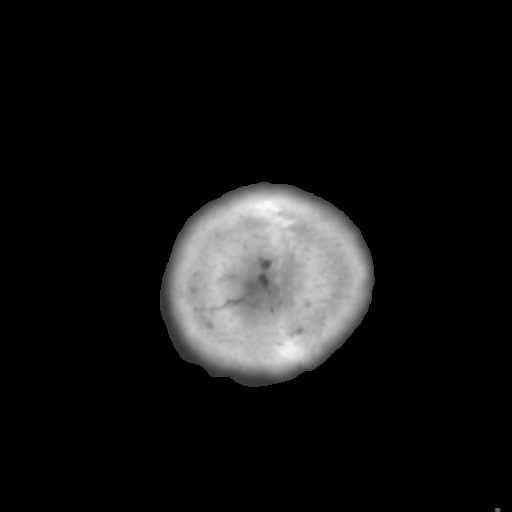

[Series 4: coronal soft tissue · coronal · 0.33mm/px · 3 of 68 slices shown]
[im 23/68  brain]
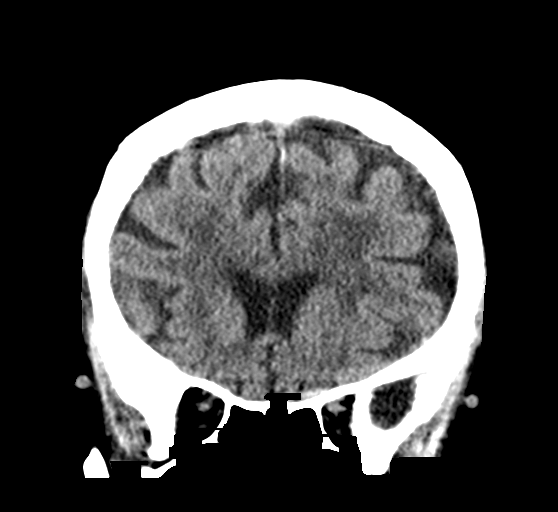
[im 30/68  brain]
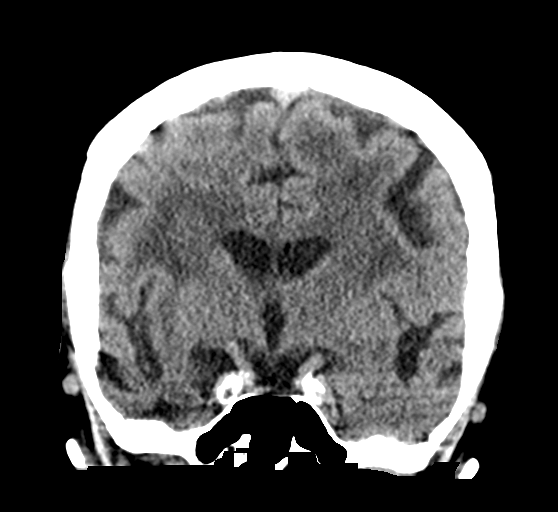
[im 38/68  brain]
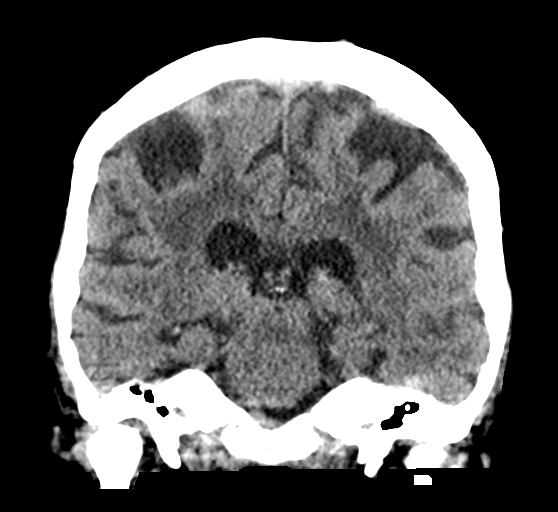

[Series 5: sagittal soft tissue · sagittal · 0.32mm/px · 3 of 57 slices shown]
[im 19/57  brain]
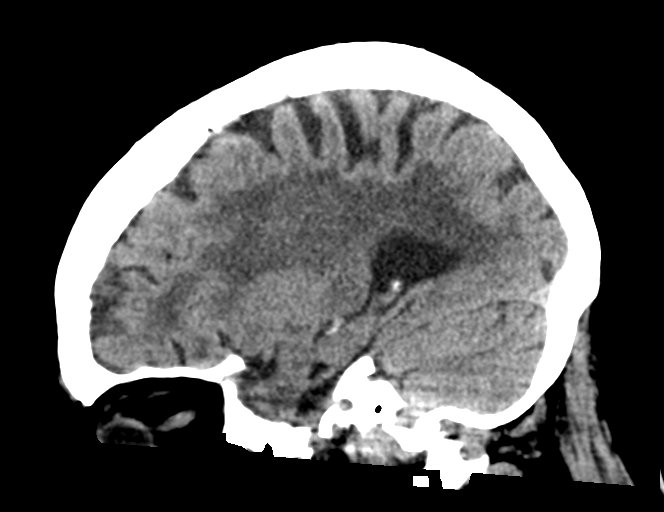
[im 29/57  brain]
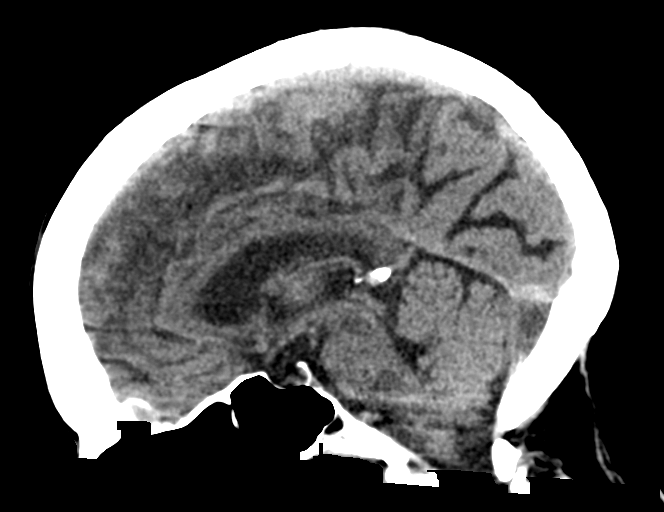
[im 38/57  brain]
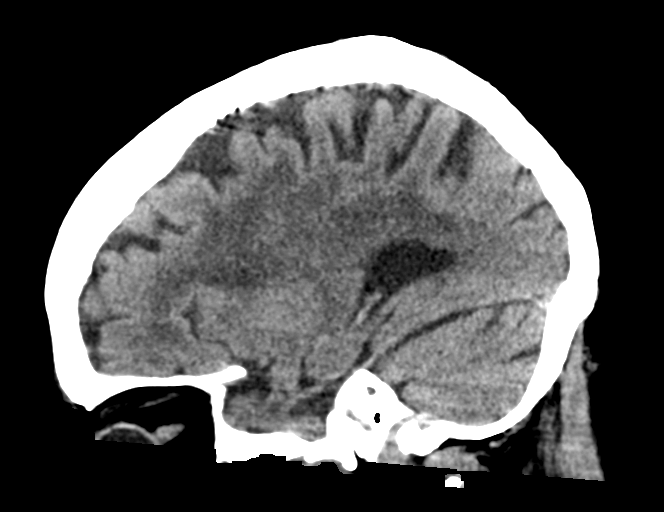

[15 of 47 positions shown; findings below may reference images not displayed]

FINDINGS: Brain: Stable age related cerebral atrophy, ventriculomegaly and
periventricular white matter disease. No extra-axial fluid
collections are identified. No CT findings for acute hemispheric
infarction or intracranial hemorrhage. No mass lesions. The
brainstem and cerebellum are normal.

Vascular: No hyperdense vessel or unexpected calcification.

Skull: No skull fracture or bone lesions.

Sinuses/Orbits: The paranasal sinuses and mastoid air cells are
grossly clear.

Other: No scalp lesions or hematoma.
IMPRESSION: 1. Stable age related cerebral atrophy, ventriculomegaly and
periventricular white matter disease.
2. No acute intracranial findings or mass lesions.
3. No skull fracture.

## 2020-11-18 IMAGING — DX RIGHT SHOULDER - 2+ VIEW
2 series · 2 of 2 positions shown · non-contrast
Comparison: None.

CLINICAL DATA: Right shoulder pain.

EXAM:
RIGHT SHOULDER - 2+ VIEW

[shoulder grashey]
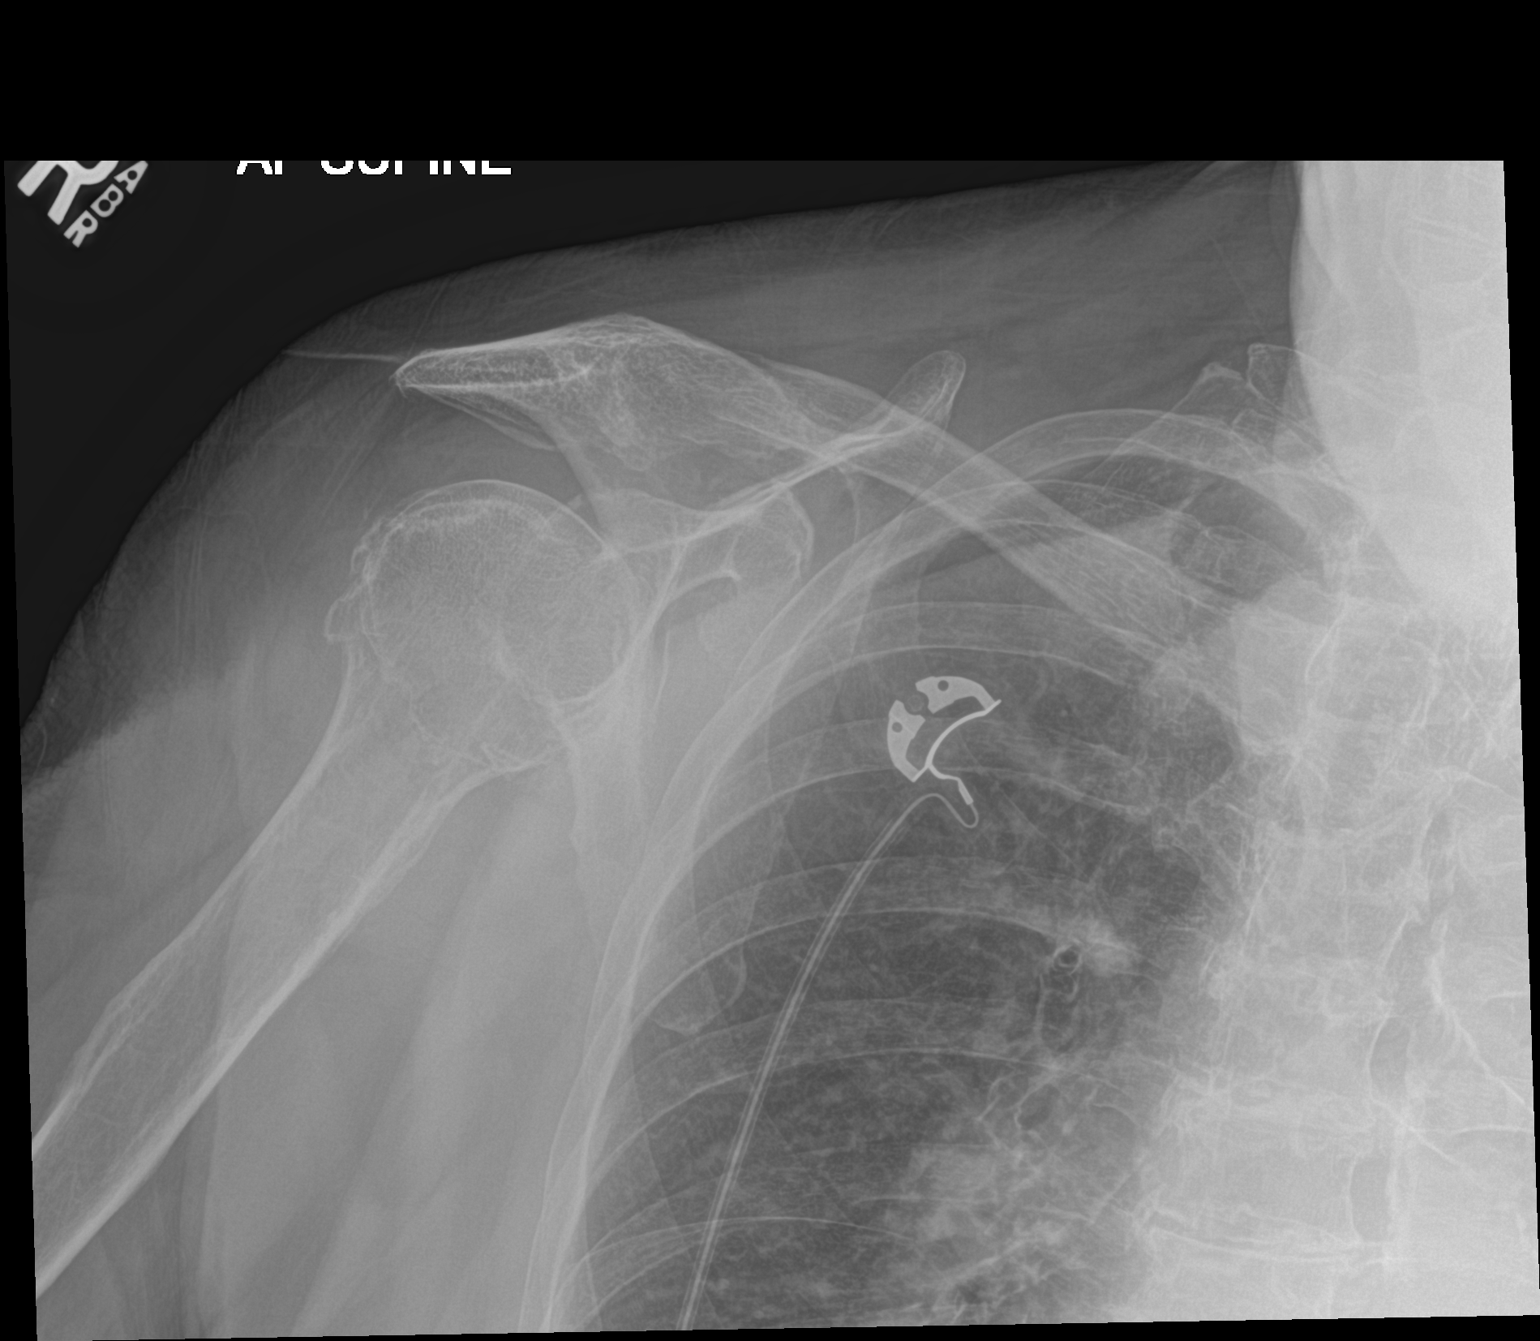

[shoulder y view]
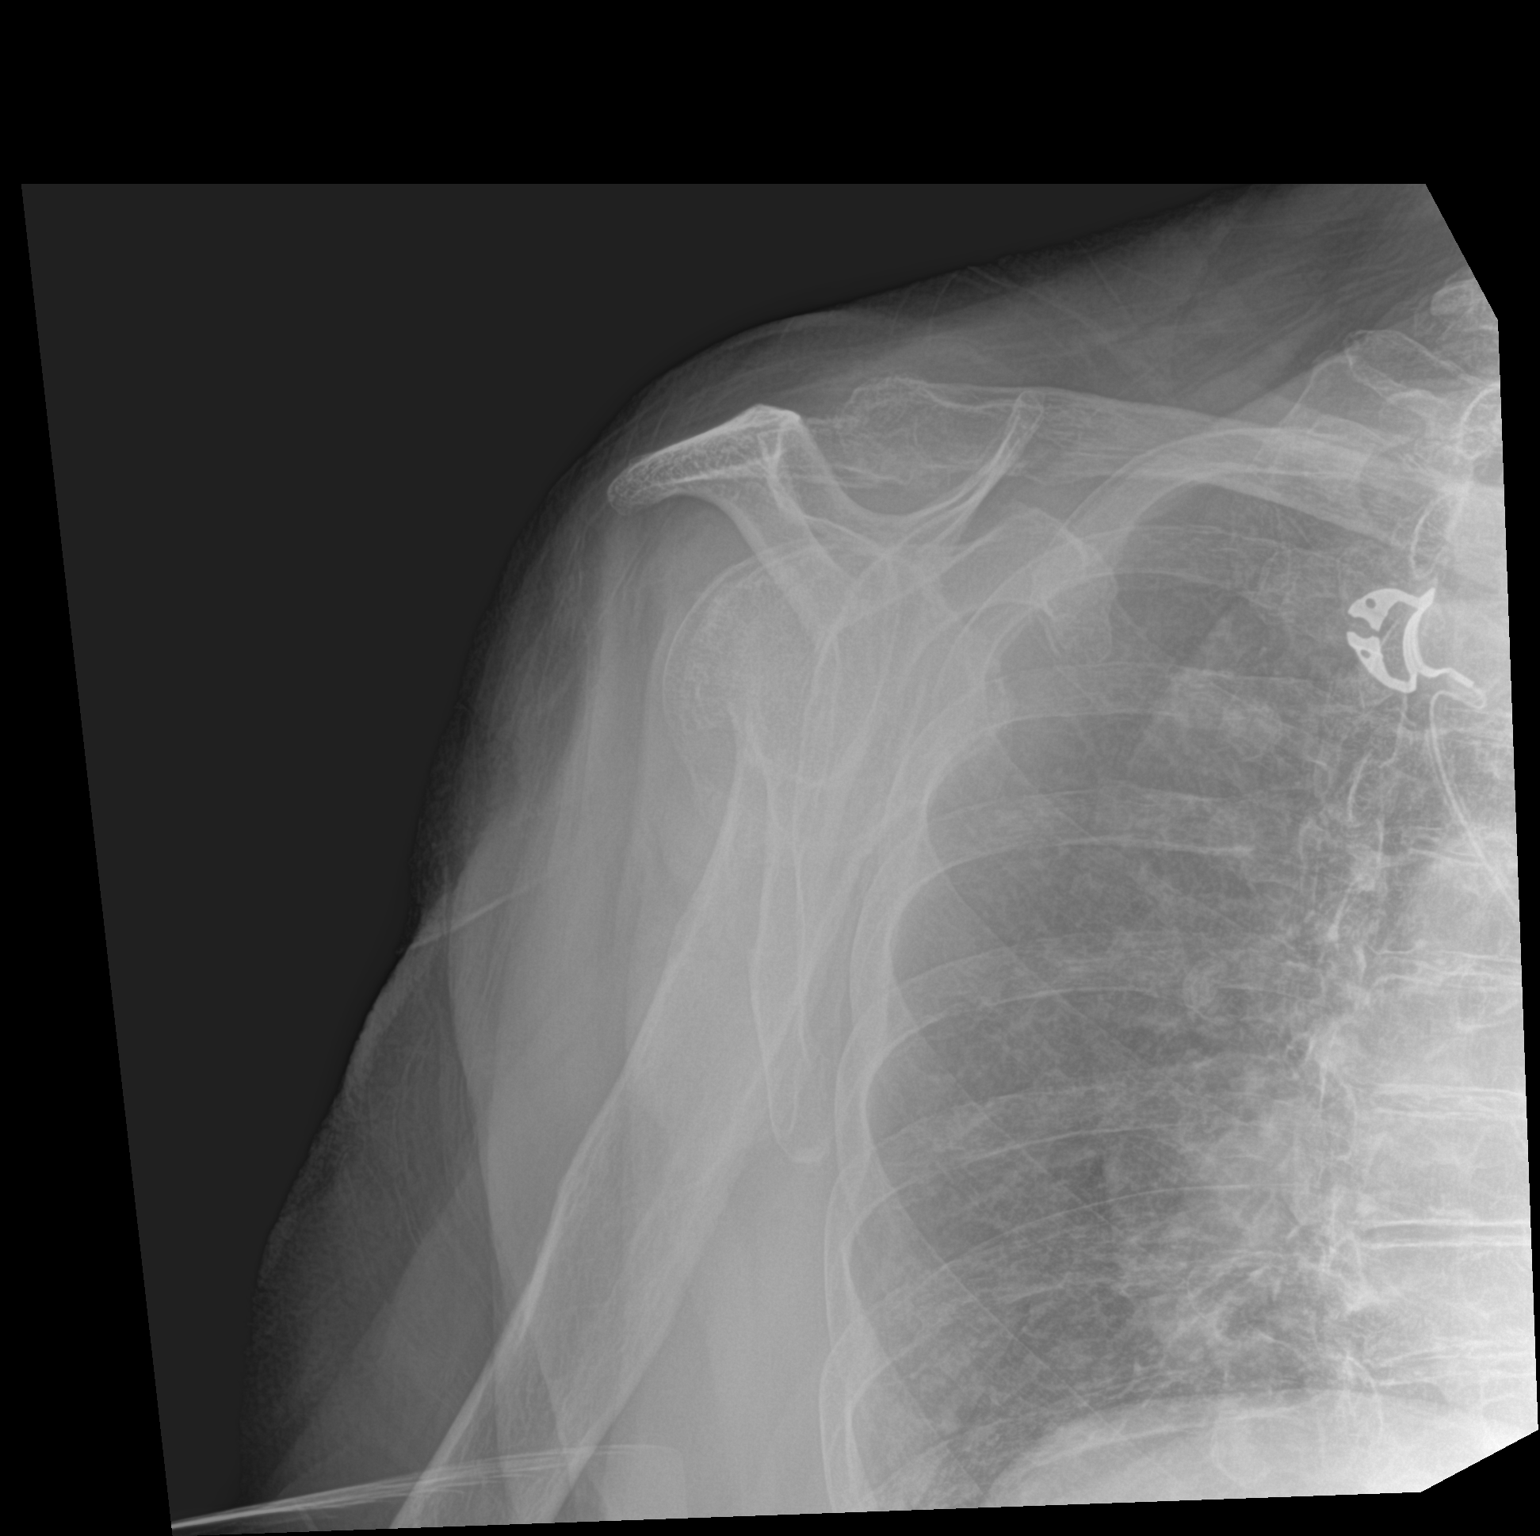

[2 of 2 positions shown; findings below may reference images not displayed]

FINDINGS: Evaluation is limited by suboptimal patient positioning. There
appears to be an acute impacted fracture of the proximal right
humerus, however evaluation is limited by positioning and lack of
additional views. There is no evidence of a glenohumeral
dislocation. There is osteopenia.
IMPRESSION: 1. Limited study secondary to technique.
2. Findings suspicious for an acute impacted fracture involving the
proximal right humerus. Consider repeat radiographs for further
evaluation.
3. No glenohumeral dislocation.
